# Patient Record
Sex: Female | Born: 1940 | Race: White | Hispanic: No | Marital: Married | Smoking: Never smoker
Health system: Southern US, Community
[De-identification: ages and names within clinical notes are randomized; demographics above are authoritative.]

## PROBLEM LIST (undated history)

## (undated) DIAGNOSIS — I639 Cerebral infarction, unspecified: Secondary | ICD-10-CM

## (undated) DIAGNOSIS — E7800 Pure hypercholesterolemia, unspecified: Secondary | ICD-10-CM

## (undated) DIAGNOSIS — I10 Essential (primary) hypertension: Secondary | ICD-10-CM

---

## 1969-04-13 HISTORY — PX: BREAST LUMPECTOMY: SHX2

## 1979-04-14 HISTORY — PX: VAGINAL HYSTERECTOMY: SUR661

## 1998-05-08 ENCOUNTER — Emergency Department (HOSPITAL_COMMUNITY): Admission: EM | Admit: 1998-05-08 | Discharge: 1998-05-08 | Payer: Self-pay | Admitting: Emergency Medicine

## 1998-07-05 ENCOUNTER — Other Ambulatory Visit: Admission: RE | Admit: 1998-07-05 | Discharge: 1998-07-05 | Payer: Self-pay | Admitting: *Deleted

## 1999-07-13 ENCOUNTER — Other Ambulatory Visit: Admission: RE | Admit: 1999-07-13 | Discharge: 1999-07-13 | Payer: Self-pay | Admitting: *Deleted

## 2000-07-22 ENCOUNTER — Other Ambulatory Visit: Admission: RE | Admit: 2000-07-22 | Discharge: 2000-07-22 | Payer: Self-pay | Admitting: *Deleted

## 2001-07-22 ENCOUNTER — Other Ambulatory Visit: Admission: RE | Admit: 2001-07-22 | Discharge: 2001-07-22 | Payer: Self-pay | Admitting: *Deleted

## 2003-02-08 ENCOUNTER — Ambulatory Visit (HOSPITAL_COMMUNITY): Admission: RE | Admit: 2003-02-08 | Discharge: 2003-02-08 | Payer: Self-pay | Admitting: Internal Medicine

## 2011-09-20 ENCOUNTER — Emergency Department (HOSPITAL_COMMUNITY): Payer: Medicare Other

## 2011-09-20 ENCOUNTER — Encounter (HOSPITAL_COMMUNITY): Payer: Self-pay | Admitting: *Deleted

## 2011-09-20 ENCOUNTER — Emergency Department (HOSPITAL_COMMUNITY)
Admission: EM | Admit: 2011-09-20 | Discharge: 2011-09-20 | Disposition: A | Discharge status: Home / Self Care | Payer: Medicare Other | Attending: Emergency Medicine | Admitting: Emergency Medicine

## 2011-09-20 DIAGNOSIS — M25476 Effusion, unspecified foot: Secondary | ICD-10-CM | POA: Insufficient documentation

## 2011-09-20 DIAGNOSIS — I10 Essential (primary) hypertension: Secondary | ICD-10-CM | POA: Insufficient documentation

## 2011-09-20 DIAGNOSIS — M25579 Pain in unspecified ankle and joints of unspecified foot: Secondary | ICD-10-CM | POA: Insufficient documentation

## 2011-09-20 DIAGNOSIS — Z79899 Other long term (current) drug therapy: Secondary | ICD-10-CM | POA: Insufficient documentation

## 2011-09-20 DIAGNOSIS — M25473 Effusion, unspecified ankle: Secondary | ICD-10-CM | POA: Insufficient documentation

## 2011-09-20 DIAGNOSIS — E7800 Pure hypercholesterolemia, unspecified: Secondary | ICD-10-CM | POA: Insufficient documentation

## 2011-09-20 DIAGNOSIS — M25569 Pain in unspecified knee: Secondary | ICD-10-CM | POA: Insufficient documentation

## 2011-09-20 HISTORY — DX: Essential (primary) hypertension: I10

## 2011-09-20 HISTORY — DX: Pure hypercholesterolemia, unspecified: E78.00

## 2011-09-20 MED ORDER — IBUPROFEN 200 MG PO TABS
600.0000 mg | ORAL_TABLET | Freq: Once | ORAL | Status: AC
  Administered 2011-09-20: 600 mg via ORAL
  Filled 2011-09-20: qty 3

## 2011-09-20 MED ORDER — OXYCODONE-ACETAMINOPHEN 5-325 MG PO TABS
1.0000 | ORAL_TABLET | Freq: Once | ORAL | Status: AC
  Administered 2011-09-20: 1 via ORAL
  Filled 2011-09-20: qty 1

## 2011-09-20 MED ORDER — NAPROXEN 500 MG PO TABS: 500.0000 mg | ORAL_TABLET | Freq: Two times a day (BID) | ORAL | Status: DC

## 2011-09-20 MED ORDER — HYDROCODONE-ACETAMINOPHEN 5-500 MG PO TABS: 1.0000 | ORAL_TABLET | Freq: Four times a day (QID) | ORAL | Status: AC | PRN

## 2011-09-20 NOTE — ED Notes (Signed)
Pt states "I have been having trouble with my leg but today, I was walking & if felt like it locked & popped, hurts in the back"; pt indicates is left posterior knee

## 2011-09-20 NOTE — ED Provider Notes (Signed)
History     CSN: [id number]  Arrival date & time 09/20/11  1529   First MD Initiated Contact with Patient 09/20/11 1610      Chief Complaint  Patient presents with  . Knee Pain    (Consider location/radiation/quality/duration/timing/severity/associated sxs/prior treatment) HPI  Patient presents to emergency department complaining of left knee pain. Patient states that for greater than a month she'll notice aching or soreness in her left knee particularly when she does activities like walking on the treadmill at the gym or climbing stairs however cycling or using the elliptical has little to no pain. Patient states that she has taken occasional Aleve with good relief of the pain until patient states that today she was walking through her kitchen and felt her knee "pop and almost give out." At that time patient states she had acute onset of pain with pain aggravated by weight bearing and ambulation. Patient did not take anything for pain prior to arrival. Patient denies any swelling or deformity of knee. Pain is aggravated by weight bearing mostly. Patient denies any pain radiating from her knee. Patient states pain is primarily in the posterior aspect of knee. Patient denies history of having an orthopedic surgeon. Patient denies chest pain, shortness of breath, coughing, hemoptysis, history of PE, or DVT, recent immobilization or travel.   Past Medical History  Diagnosis Date  . Hypertension   . Hypercholesteremia     Past Surgical History  Procedure Date  . Abdominal hysterectomy   . Breast lumpectomy     right    No family history on file.  History  Substance Use Topics  . Smoking status: Never Smoker   . Smokeless tobacco: Not on file  . Alcohol Use: No    OB History    Grav Para Term Preterm Abortions TAB SAB Ect Mult Living                  Review of Systems  All other systems reviewed and are negative.    Allergies  Review of patient's allergies indicates no  known allergies.  Home Medications   Current Outpatient Rx  Name Route Sig Dispense Refill  . ASPIRIN EC 81 MG PO TBEC Oral Take 81 mg by mouth daily.    . ATENOLOL 50 MG PO TABS Oral Take 50 mg by mouth daily.    Marland Kitchen CALCIUM CARBONATE-VITAMIN D 500-200 MG-UNIT PO TABS Oral Take 1 tablet by mouth daily.    . ADULT MULTIVITAMIN W/MINERALS CH Oral Take 1 tablet by mouth daily.    Marland Kitchen NAPROXEN SODIUM 220 MG PO TABS Oral Take 440 mg by mouth 2 (two) times daily as needed. For pain.    Marland Kitchen ROSUVASTATIN CALCIUM 10 MG PO TABS Oral Take 5 mg by mouth daily.    Marland Kitchen VALSARTAN-HYDROCHLOROTHIAZIDE 320-25 MG PO TABS Oral Take 1 tablet by mouth daily.      BP 157/89  Pulse 62  Temp(Src) 98.9 F (37.2 C) (Oral)  Resp 18  Ht 5\' 3"  (1.6 m)  Wt 186 lb (84.369 kg)  BMI 32.95 kg/m2  SpO2 100%  Physical Exam  Constitutional: She is oriented to person, place, and time. She appears well-developed and well-nourished. No distress.  HENT:  Head: Normocephalic and atraumatic.  Eyes: Conjunctivae are normal.  Cardiovascular: Normal rate and regular rhythm.   Pulmonary/Chest: Effort normal.  Musculoskeletal: Normal range of motion. She exhibits tenderness. She exhibits no edema.       Right ankle: She exhibits swelling.  tenderness.       TTP of left posterior knee in popliteal fossa but no palpable mass or fullness. Pain with FROM but no crepitous or laxity with ant/post/medial/lateral stress of knee.  No TTP or pain with FROM of left hip or ankle.   Neurological: She is alert and oriented to person, place, and time.       Normal sensation of entire foot.   Skin: Skin is warm and dry. No rash noted. She is not diaphoretic. No erythema. No pallor.  Psychiatric: She has a normal mood and affect. Her behavior is normal.    ED Course  Procedures (including critical care time)  PO ibuprofen and percocet.   Crutches and immobilizer.   Labs Reviewed - No data to display Dg Knee Complete 4 Views  Left  09/20/2011  *RADIOLOGY REPORT*  Clinical Data: Knee pain  LEFT KNEE - COMPLETE 4+ VIEW  Comparison: None.  Findings: Four views of the left knee submitted.  No acute fracture or subluxation.  Mild spurring of the patella is noted.  No radiopaque foreign body.  Minimal narrowing of medial joint compartment.  No joint effusion.  IMPRESSION: .  No acute fracture or subluxation.  Mild degenerative changes.  Original Report Authenticated By: Natasha Mead, M.D.     1. Knee pain       MDM  Patient hx of one month of knee pain with "pounding or weight bearing" with "give our or pop" today question meniscal injury. LLE is neurovascularly intact without acute findings on xray. Patient is agreeable to follow up with ortho and her PCP for further evaluation of ongoing knee pain.         Jenness Corner, [place] 09/20/11 1657

## 2011-09-20 NOTE — ED Provider Notes (Signed)
Medical screening examination/treatment/procedure(s) were conducted as a shared visit with non-physician practitioner(s) and myself.  I personally evaluated the patient during the encounter   Meredith Brunelle A. Patrica Duel, MD 09/20/11 2316

## 2012-03-12 ENCOUNTER — Other Ambulatory Visit: Payer: Self-pay | Admitting: Internal Medicine

## 2012-03-12 ENCOUNTER — Emergency Department (HOSPITAL_COMMUNITY): Payer: Medicare Other

## 2012-03-12 ENCOUNTER — Encounter (HOSPITAL_COMMUNITY): Payer: Self-pay | Admitting: *Deleted

## 2012-03-12 ENCOUNTER — Inpatient Hospital Stay (HOSPITAL_COMMUNITY)
Admission: EM | Admit: 2012-03-12 | Discharge: 2012-03-14 | DRG: 066 | Disposition: A | Discharge status: Home / Self Care | Payer: Medicare Other | Attending: Internal Medicine | Admitting: Internal Medicine

## 2012-03-12 DIAGNOSIS — R911 Solitary pulmonary nodule: Secondary | ICD-10-CM | POA: Diagnosis present

## 2012-03-12 DIAGNOSIS — R29898 Other symptoms and signs involving the musculoskeletal system: Secondary | ICD-10-CM

## 2012-03-12 DIAGNOSIS — I6350 Cerebral infarction due to unspecified occlusion or stenosis of unspecified cerebral artery: Principal | ICD-10-CM | POA: Diagnosis present

## 2012-03-12 DIAGNOSIS — I6789 Other cerebrovascular disease: Secondary | ICD-10-CM | POA: Diagnosis present

## 2012-03-12 DIAGNOSIS — E785 Hyperlipidemia, unspecified: Secondary | ICD-10-CM | POA: Diagnosis present

## 2012-03-12 DIAGNOSIS — Z7982 Long term (current) use of aspirin: Secondary | ICD-10-CM

## 2012-03-12 DIAGNOSIS — I639 Cerebral infarction, unspecified: Secondary | ICD-10-CM | POA: Diagnosis present

## 2012-03-12 DIAGNOSIS — E663 Overweight: Secondary | ICD-10-CM | POA: Diagnosis present

## 2012-03-12 DIAGNOSIS — Z79899 Other long term (current) drug therapy: Secondary | ICD-10-CM

## 2012-03-12 DIAGNOSIS — I10 Essential (primary) hypertension: Secondary | ICD-10-CM | POA: Diagnosis present

## 2012-03-12 HISTORY — DX: Cerebral infarction, unspecified: I63.9

## 2012-03-12 LAB — CBC WITH DIFFERENTIAL/PLATELET
Basophils Absolute: 0.1 10*3/uL (ref 0.0–0.1)
Eosinophils Absolute: 0.1 10*3/uL (ref 0.0–0.7)
Eosinophils Relative: 1 % (ref 0–5)
HCT: 38.1 % (ref 36.0–46.0)
Lymphocytes Relative: 33 % (ref 12–46)
MCH: 31.5 pg (ref 26.0–34.0)
MCHC: 34.4 g/dL (ref 30.0–36.0)
MCV: 91.6 fL (ref 78.0–100.0)
Monocytes Absolute: 0.6 10*3/uL (ref 0.1–1.0)
Platelets: 212 10*3/uL (ref 150–400)
RDW: 12.8 % (ref 11.5–15.5)
WBC: 6 10*3/uL (ref 4.0–10.5)

## 2012-03-12 LAB — URINALYSIS, ROUTINE W REFLEX MICROSCOPIC
Glucose, UA: NEGATIVE mg/dL
Hgb urine dipstick: NEGATIVE
Ketones, ur: NEGATIVE mg/dL
Protein, ur: NEGATIVE mg/dL
Urobilinogen, UA: 0.2 mg/dL (ref 0.0–1.0)

## 2012-03-12 LAB — GLUCOSE, CAPILLARY: Glucose-Capillary: 91 mg/dL (ref 70–99)

## 2012-03-12 LAB — BASIC METABOLIC PANEL
CO2: 28 mEq/L (ref 19–32)
Calcium: 10.2 mg/dL (ref 8.4–10.5)
Creatinine, Ser: 1.05 mg/dL (ref 0.50–1.10)
GFR calc non Af Amer: 53 mL/min — ABNORMAL LOW (ref >90)
Glucose, Bld: 96 mg/dL (ref 70–99)
Sodium: 141 mEq/L (ref 135–145)

## 2012-03-12 MED ORDER — SODIUM CHLORIDE 0.9 % IV SOLN
INTRAVENOUS | Status: DC
  Administered 2012-03-12: 125 mL/h via INTRAVENOUS

## 2012-03-12 NOTE — ED Notes (Signed)
Report given to Samantha, RN

## 2012-03-12 NOTE — ED Notes (Signed)
Pt undressed, in gown, speaking with registration at this time

## 2012-03-12 NOTE — ED Provider Notes (Signed)
History     CSN: [id number]  Arrival date & time 03/12/12  1536   First MD Initiated Contact with Patient 03/12/12 1613      Chief Complaint  Patient presents with  . Extremity Weakness    (Consider location/radiation/quality/duration/timing/severity/associated sxs/prior treatment) Patient is a 71 y.o. female presenting with neurologic complaint. The history is provided by the patient.  Neurologic Problem The primary symptoms include focal weakness. Primary symptoms do not include headaches, syncope, loss of consciousness, altered mental status, seizures, dizziness, visual change, paresthesias, loss of sensation, speech change, memory loss, fever, nausea or vomiting. The symptoms began 12 to 24 hours ago. The symptoms are unchanged. The neurological symptoms are focal. Context: upon waking up.  Region/motion of weakness: L arm. There is no impairment of the following actions: swallowing or articulating words.  Additional symptoms include weakness and loss of balance. Additional symptoms do not include pain, lower back pain, nystagmus or vertigo. Medical issues also include hypertension. Medical issues do not include seizures or cerebral vascular accident.    Past Medical History  Diagnosis Date  . Hypertension   . Hypercholesteremia     Past Surgical History  Procedure Date  . Abdominal hysterectomy   . Breast lumpectomy     right    History reviewed. No pertinent family history.  History  Substance Use Topics  . Smoking status: Never Smoker   . Smokeless tobacco: Not on file  . Alcohol Use: No    OB History    Grav Para Term Preterm Abortions TAB SAB Ect Mult Living                  Review of Systems  Constitutional: Negative for fever and chills.  Respiratory: Negative for cough and shortness of breath.   Cardiovascular: Negative for chest pain, palpitations and syncope.  Gastrointestinal: Negative for nausea, vomiting and abdominal pain.  Musculoskeletal:  Negative for back pain.  Skin: Negative for color change and rash.  Neurological: Positive for focal weakness, weakness and loss of balance. Negative for dizziness, vertigo, speech change, seizures, loss of consciousness, syncope, facial asymmetry, speech difficulty, light-headedness, numbness, headaches and paresthesias.  Psychiatric/Behavioral: Negative for memory loss, confusion and altered mental status.  All other systems reviewed and are negative.    Allergies  Review of patient's allergies indicates no known allergies.  Home Medications   Current Outpatient Rx  Name Route Sig Dispense Refill  . ASPIRIN EC 81 MG PO TBEC Oral Take 81 mg by mouth daily.    . ATENOLOL 50 MG PO TABS Oral Take 50 mg by mouth daily.    Marland Kitchen CALCIUM CARBONATE-VITAMIN D 500-200 MG-UNIT PO TABS Oral Take 1 tablet by mouth daily.    . ADULT MULTIVITAMIN W/MINERALS CH Oral Take 1 tablet by mouth daily.    Marland Kitchen ROSUVASTATIN CALCIUM 10 MG PO TABS Oral Take 5 mg by mouth daily.    Marland Kitchen VALSARTAN-HYDROCHLOROTHIAZIDE 320-25 MG PO TABS Oral Take 1 tablet by mouth daily.      BP 184/90  Pulse 51  Temp 98.3 F (36.8 C) (Oral)  Resp 18  SpO2 99%  Physical Exam  Nursing note and vitals reviewed. Constitutional: She is oriented to person, place, and time. She appears well-developed and well-nourished.  HENT:  Head: Normocephalic and atraumatic.  Eyes: EOM are normal. Pupils are equal, round, and reactive to light. Left eye exhibits no nystagmus.  Cardiovascular: Normal rate, regular rhythm, normal heart sounds and intact distal pulses.   Pulmonary/Chest:  Effort normal and breath sounds normal. No respiratory distress.  Abdominal: Soft. She exhibits no distension. There is no tenderness.  Neurological: She is alert and oriented to person, place, and time. No cranial nerve deficit or sensory deficit. She displays a negative Romberg sign. Gait normal. GCS eye subscore is 4. GCS verbal subscore is 5. GCS motor subscore is  6.  Reflex Scores:      Bicep reflexes are 2+ on the right side and 2+ on the left side.      Patellar reflexes are 2+ on the right side and 2+ on the left side.      4/5 strength L triceps, biceps, deltoids, otherwise 5/5 throughout. Dysmetria L arm; normal coordination with R arm, bilateral heel-to-shin  Skin: Skin is warm and dry.  Psychiatric: She has a normal mood and affect.    ED Course  Procedures (including critical care time)  Labs Reviewed  BASIC METABOLIC PANEL - Abnormal; Notable for the following:    GFR calc non Af Amer 53 (*)     GFR calc Af Amer 61 (*)     All other components within normal limits  URINALYSIS, ROUTINE W REFLEX MICROSCOPIC - Abnormal; Notable for the following:    Leukocytes, UA SMALL (*)     All other components within normal limits  GLUCOSE, CAPILLARY  CBC WITH DIFFERENTIAL  URINE MICROSCOPIC-ADD ON   Ct Head Wo Contrast  03/12/2012  *RADIOLOGY REPORT*  Clinical Data: Extremity weakness.  CT HEAD WITHOUT CONTRAST  Technique:  Contiguous axial images were obtained from the base of the skull through the vertex without contrast.  Comparison: None.  Findings: No evidence for acute hemorrhage, mass lesion, midline shift, hydrocephalus or large infarct.  Patchy areas of low density in the subcortical white matter, particularly in the parietal lobes.  The visualized sinuses are clear.  No acute osseous abnormality.  IMPRESSION: No acute intracranial abnormality.  Patchy areas of low density within the white matter.  Findings most likely represent chronic small vessel ischemic changes.  Original Report Authenticated By: Richarda Overlie, M.D.   Mr Angiogram Head Wo Contrast  03/12/2012  *RADIOLOGY REPORT*  Clinical Data:  Left arm weakness.  Dysmetria.  MRI HEAD WITHOUT CONTRAST MRA HEAD WITHOUT CONTRAST  Technique:  Multiplanar, multiecho pulse sequences of the brain and surrounding structures were obtained without intravenous contrast. Angiographic images of the head  were obtained using MRA technique without contrast.  Comparison:  CT head without contrast 03/12/2012.  MRI HEAD  Findings:  An acute non hemorrhagic linear cortical infarct is present in the posterior right frontal lobe, likely along the primary motor cortex.  Focal T2 and FLAIR hyperintensities associated with this lesion, compatible with an infarct of several hours.  No mass lesion or other acute hemorrhage is present.  Confluent periventricular and scattered subcortical and T2 FLAIR hyperintensities are present bilaterally.  Flow is present in the major intracranial arteries.  The globes and orbits are intact.  The paranasal sinuses and mastoid air cells are clear.  IMPRESSION:  1.  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 The lesion measures approximately 1.5 cm. 2.  Atrophy and extensive white matter disease.  This likely reflects the sequelae of chronic microvascular ischemia.  MRA HEAD  Findings: The left internal carotid artery is within normal limits. There is a slight downward outpouching of the right internal carotid artery at the level of the posterior communicating artery, measuring less than  1 mm.  The A1 and M1 segments are normal.  The anterior communicating artery is patent.  The MCA bifurcations are within normal limits bilaterally.  The ACA and MCA branch vessels are unremarkable.  The left vertebral artery is slightly dominant to the right.  The left PICA origin is visualized and normal.  Prominent AICA vessels are seen bilaterally.  The basilar artery is within normal limits. Both posterior cerebral arteries originate from basilar tip.  The PCA branch vessels are within normal limits bilaterally.  IMPRESSION:  1.  Tiny, less than 1 mm, right posterior communicating artery aneurysm. 2.  Otherwise unremarkable MRA circle of Willis without evidence for significant proximal stenosis, branch vessel occlusion, or other aneurysm.  Original  Report Authenticated By: Jamesetta Orleans. MATTERN, M.D.   Mr Brain Wo Contrast  03/12/2012  *RADIOLOGY REPORT*  Clinical Data:  Left arm weakness.  Dysmetria.  MRI HEAD WITHOUT CONTRAST MRA HEAD WITHOUT CONTRAST  Technique:  Multiplanar, multiecho pulse sequences of the brain and surrounding structures were obtained without intravenous contrast. Angiographic images of the head were obtained using MRA technique without contrast.  Comparison:  CT head without contrast 03/12/2012.  MRI HEAD  Findings:  An acute non hemorrhagic linear cortical infarct is present in the posterior right frontal lobe, likely along the primary motor cortex.  Focal T2 and FLAIR hyperintensities associated with this lesion, compatible with an infarct of several hours.  No mass lesion or other acute hemorrhage is present.  Confluent periventricular and scattered subcortical and T2 FLAIR hyperintensities are present bilaterally.  Flow is present in the major intracranial arteries.  The globes and orbits are intact.  The paranasal sinuses and mastoid air cells are clear.  IMPRESSION:  1.  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 The lesion measures approximately 1.5 cm. 2.  Atrophy and extensive white matter disease.  This likely reflects the sequelae of chronic microvascular ischemia.  MRA HEAD  Findings: The left internal carotid artery is within normal limits. There is a slight downward outpouching of the right internal carotid artery at the level of the posterior communicating artery, measuring less than 1 mm.  The A1 and M1 segments are normal.  The anterior communicating artery is patent.  The MCA bifurcations are within normal limits bilaterally.  The ACA and MCA branch vessels are unremarkable.  The left vertebral artery is slightly dominant to the right.  The left PICA origin is visualized and normal.  Prominent AICA vessels are seen bilaterally.  The basilar artery is within  normal limits. Both posterior cerebral arteries originate from basilar tip.  The PCA branch vessels are within normal limits bilaterally.  IMPRESSION:  1.  Tiny, less than 1 mm, right posterior communicating artery aneurysm. 2.  Otherwise unremarkable MRA circle of Willis without evidence for significant proximal stenosis, branch vessel occlusion, or other aneurysm.  Original Report Authenticated By: Jamesetta Orleans. MATTERN, M.D.    Date: 03/12/2012  Rate: 50  Rhythm: sinus bradycardia  QRS Axis: normal  Intervals: PR prolonged  ST/T Wave abnormalities: normal  Conduction Disutrbances:first-degree A-V block   Narrative Interpretation:   Old EKG Reviewed: none available     1. Stroke       MDM  71 year old left-hand dominant female presenting with left arm weakness. She states was present when she woke up this morning, and she noticed it when she was trying to hold the phone. She also notes that when she is  trying to use the elliptical she was having difficulties balancing on it, but has not noticed any other balance difficulties today. She has no other neurologic complaints, has no known injuries to the arm, and has never had previous similar symptoms before. She was seen by her regular Dr., who referred her to the ED for further evaluation due to his inability to get imaging as an outpatient. The patient does get a mild wedge discomfort lasting for about 20 minutes while she was at her doctor's office, but denies any other headaches, any head trauma. She has mild weakness of her left arm and some dysmetria as above. CT scan obtained was unremarkable. Neurology was consult in, and an MRI was obtained, which showed a acute stroke so an MRA was obtained which was grossly unremarkable. The patient will be admitted to the hospitalist service for further management. The patient was updated with these findings and the plan.        Theotis Burrow, MD 03/13/12 1610  Theotis Burrow, MD 03/13/12 [phone]

## 2012-03-12 NOTE — ED Notes (Signed)
To ED for eval of loss of control of left arm. Pt states she woke with this today. Pt states she had a small HA earlier today. No pain now. Pt is walking without any difficulty. No slurred speech. No facial droop.

## 2012-03-12 NOTE — ED Notes (Signed)
Family at bedside. 

## 2012-03-12 NOTE — ED Provider Notes (Signed)
I saw and evaluated the patient, reviewed the resident's note and I agree with the findings and plan. LHD female.  Was talking on phone, felt as if left arm was clumsy.  Could not hold phone.   Says she cannot control it. sxs began this am.  No pain. No ha, n/v/vision changes.  No hx of stroke or ami. Nonsmoker.  On exam no distress.  Cn nl. hrt and lungs nl.  Has dysmetria on left when asked to do F---> N test.   Strength in left upper ext 4/5.  bilat legs nl.   Ct neg.  Will do mri for eval of clumsy left arm and dysmetria.  Cheri Guppy, MD 03/12/12 [phone]

## 2012-03-12 NOTE — Consult Note (Signed)
Reason for Consult: Left arm clumsiness Referring Physician: ER, primary doctor is Dr. Tami Ribas Huff is an 71 y.o. female.  HPI:  Meredith Huff is a 71 year old left-handed white female with a history of hypertension and dyslipidemia. This patient was in good health, last seen normal around 9:30 PM on 03/11/2012. The patient went to bed at that time, and she got out of bed around 6 AM, and realized that the left arm was weak and clumsy. The left felt heavy, but she denied any problems with the legs. The patient had no numbness, visual disturbance, slurred speech, problems swallowing. The patient did report a mild headache. The patient went to work out, and noted that she had some balance problems on the elliptical machine. The patient eventually went to the emergency room as the left arm clumsiness did not improve. The patient denies any prior history of weakness or clumsiness. The patient has undergone a CT scan of the head that was unremarkable. MRI the brain is pending. NIH stroke scale score is 2. The patient is not a TPA candidate secondary to the duration of the symptoms. Neurology was called for an evaluation. The patient was on low-dose aspirin prior to admission.     Past Medical History  Diagnosis Date  . Hypertension   . Hypercholesteremia   . Stroke     Left arm clumsiness    Past Surgical History  Procedure Date  . Abdominal hysterectomy   . Breast lumpectomy     right    Family History  Problem Relation Age of Onset  . Heart attack Father   . Dementia Mother     Stroke, heart disease    Social History:  reports that she has never smoked. She does not have any smokeless tobacco history on file. She reports that she does not drink alcohol or use illicit drugs.  Allergies: No Known Allergies  Medications:  Prior to Admission:  (Not in a hospital admission) Scheduled:  Continuous:   . sodium chloride 125 mL/hr (03/12/12 1829)   PRN:  Results for orders  placed during the hospital encounter of 03/12/12 (from the past 48 hour(s))  GLUCOSE, CAPILLARY     Status: Normal   Collection Time   03/12/12  5:01 PM      Component Value Range Comment   Glucose-Capillary 91  70 - 99 mg/dL    Comment 1 Documented in Chart      Comment 2 Notify RN     CBC WITH DIFFERENTIAL     Status: Normal   Collection Time   03/12/12  5:30 PM      Component Value Range Comment   WBC 6.0  4.0 - 10.5 K/uL    RBC 4.16  3.87 - 5.11 MIL/uL    Hemoglobin 13.1  12.0 - 15.0 g/dL    HCT 16.1  09.6 - 04.5 %    MCV 91.6  78.0 - 100.0 fL    MCH 31.5  26.0 - 34.0 pg    MCHC 34.4  30.0 - 36.0 g/dL    RDW 40.9  81.1 - 91.4 %    Platelets 212  150 - 400 K/uL    Neutrophils Relative 55  43 - 77 %    Neutro Abs 3.3  1.7 - 7.7 K/uL    Lymphocytes Relative 33  12 - 46 %    Lymphs Abs 2.0  0.7 - 4.0 K/uL    Monocytes Relative 10  3 - 12 %  Monocytes Absolute 0.6  0.1 - 1.0 K/uL    Eosinophils Relative 1  0 - 5 %    Eosinophils Absolute 0.1  0.0 - 0.7 K/uL    Basophils Relative 1  0 - 1 %    Basophils Absolute 0.1  0.0 - 0.1 K/uL   BASIC METABOLIC PANEL     Status: Abnormal   Collection Time   03/12/12  5:30 PM      Component Value Range Comment   Sodium 141  135 - 145 mEq/L    Potassium 3.7  3.5 - 5.1 mEq/L    Chloride 101  96 - 112 mEq/L    CO2 28  19 - 32 mEq/L    Glucose, Bld 96  70 - 99 mg/dL    BUN 20  6 - 23 mg/dL    Creatinine, Ser 1.61  0.50 - 1.10 mg/dL    Calcium 09.6  8.4 - 10.5 mg/dL    GFR calc non Af Amer 53 (*) >90 mL/min    GFR calc Af Amer 61 (*) >90 mL/min   URINALYSIS, ROUTINE W REFLEX MICROSCOPIC     Status: Abnormal   Collection Time   03/12/12  6:47 PM      Component Value Range Comment   Color, Urine YELLOW  YELLOW    APPearance CLEAR  CLEAR    Specific Gravity, Urine 1.010  1.005 - 1.030    pH 7.0  5.0 - 8.0    Glucose, UA NEGATIVE  NEGATIVE mg/dL    Hgb urine dipstick NEGATIVE  NEGATIVE    Bilirubin Urine NEGATIVE  NEGATIVE    Ketones,  ur NEGATIVE  NEGATIVE mg/dL    Protein, ur NEGATIVE  NEGATIVE mg/dL    Urobilinogen, UA 0.2  0.0 - 1.0 mg/dL    Nitrite NEGATIVE  NEGATIVE    Leukocytes, UA SMALL (*) NEGATIVE   URINE MICROSCOPIC-ADD ON     Status: Normal   Collection Time   03/12/12  6:47 PM      Component Value Range Comment   Squamous Epithelial / LPF RARE  RARE    WBC, UA 3-6  <3 WBC/hpf     Ct Head Wo Contrast  03/12/2012  *RADIOLOGY REPORT*  Clinical Data: Extremity weakness.  CT HEAD WITHOUT CONTRAST  Technique:  Contiguous axial images were obtained from the base of the skull through the vertex without contrast.  Comparison: None.  Findings: No evidence for acute hemorrhage, mass lesion, midline shift, hydrocephalus or large infarct.  Patchy areas of low density in the subcortical white matter, particularly in the parietal lobes.  The visualized sinuses are clear.  No acute osseous abnormality.  IMPRESSION: No acute intracranial abnormality.  Patchy areas of low density within the white matter.  Findings most likely represent chronic small vessel ischemic changes.  Original Report Authenticated By: Richarda Overlie, M.D.    @ROS @  Review of systems is notable for:  Mild headache is noted, the patient denies visual complaints, visual loss.  The patient denies slurred speech, problems swallowing.  The patient denies neck pain, low back pain, or extremity pain.  The patient denies shortness of breath, chest pain, abdominal pain, nausea or vomiting.  The patient denies dizziness, or syncope.  The patient denies any significant gait disturbance, but she does have some chronic issues with bladder control. The patient denies any issues with bowel control.    Blood pressure 158/120, pulse 58, temperature 98.3 F (36.8 C), temperature source Oral, resp. rate  18, SpO2 98.00%.  Physical Examination:  General:  The patient is alert and cooperative at the time of the examination. The patient is oriented  x3.  Respiratory:  Lungs fields are clear to auscultation bilaterally.  Cardiovascular:  Examination reveals a regular rate and rhythm, no obvious murmurs or rubs are noted.  Abdominal Exam:  Abdomen is soft and nontender, positive bowel sounds are noted. No organomegaly is noted.  Extremities:  Extremities are without significant edema.    Neurologic Examination  Cranial Nerves:  Facial symmetry is present. Pupils are equal, round, and reactive to light. Visual fields are full. Speech is normal, no aphasia or dysarthria is noted. Pin prick sensation on the face is symmetric and normal.  Motor Examination: Motor testing of all 4 extremities reveals normal strength of the arms and the legs, with exception of some decreased grip strength on the left arm. Mild drift was noted with a left lower extremity.   Sensory Examination: Sensory examination of the arms and legs shows normal pinprick, soft touch, and vibration sensation throughout.  Cerebellar Examination: The patient has good finger-nose-finger and heel-to-shin bilaterally, with the exception of some ataxia with finger-nose-finger with the left upper extremity. Gait was not tested.  Deep Tendon Reflexes: Deep tendon reflexes are symmetric and normal throughout. Toes are downgoing bilaterally.  Interval: Baseline (07/31 1642) Level of Consciousness (1a. ): Alert, keenly responsive (07/31 1900) LOC Questions (1b. ): Answers both questions correctly (07/31 1900) LOC Commands (1c. ): Performs both tasks correctly (07/31 1900) Best Gaze (2. ): Normal (07/31 1900) Visual (3. ): No visual loss (07/31 1900) Facial Palsy (4. ): Normal symmetrical movements (07/31 1900) Motor Arm, Left (5a. ): No drift (07/31 1900) Motor Arm, Right (5b. ): No drift (07/31 1900) Motor Leg, Left (6a. ): Drift (07/31 1900) Motor Leg, Right (6b. ): No drift (07/31 1900) Limb Ataxia (7. ): Present in one limb (07/31 1900) Sensory (8. ): Normal, no  sensory loss (07/31 1900) Best Language (9. ): No aphasia (07/31 1900) Dysarthria (10. ): Normal (07/31 1900) Inattention/Extinction: No Abnormality (07/31 1900) Total: 2  (07/31 1900)   Assessment/Plan:  1. 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 Hypertension  3. Dyslipidemia  The patient does have some risk factors for stroke. The patient was on aspirin prior to coming in. The patient will require a stroke workup. The patient will have an MRI study of the brain, and she will need a MRA of the head and neck. The patient will have a 2-D echocardiogram. Aspirin will be continued for now, but Plavix may need to be substituted in the future. A fasting lipid panel will be done. Neurology will follow.   Meredith Huff KEITH 03/12/2012, 7:49 PM

## 2012-03-13 ENCOUNTER — Other Ambulatory Visit: Payer: Medicare Other

## 2012-03-13 ENCOUNTER — Inpatient Hospital Stay (HOSPITAL_COMMUNITY): Payer: Medicare Other

## 2012-03-13 ENCOUNTER — Encounter (HOSPITAL_COMMUNITY): Payer: Self-pay | Admitting: General Practice

## 2012-03-13 DIAGNOSIS — I6350 Cerebral infarction due to unspecified occlusion or stenosis of unspecified cerebral artery: Secondary | ICD-10-CM

## 2012-03-13 DIAGNOSIS — E785 Hyperlipidemia, unspecified: Secondary | ICD-10-CM

## 2012-03-13 DIAGNOSIS — I10 Essential (primary) hypertension: Secondary | ICD-10-CM

## 2012-03-13 LAB — LIPID PANEL
Cholesterol: 165 mg/dL (ref 0–200)
LDL Cholesterol: 92 mg/dL (ref 0–99)
Total CHOL/HDL Ratio: 3.6 RATIO
VLDL: 27 mg/dL (ref 0–40)

## 2012-03-13 MED ORDER — IRBESARTAN 300 MG PO TABS
300.0000 mg | ORAL_TABLET | Freq: Every day | ORAL | Status: DC
  Administered 2012-03-13 – 2012-03-14 (×2): 300 mg via ORAL
  Filled 2012-03-13 (×2): qty 1

## 2012-03-13 MED ORDER — ASPIRIN 325 MG PO TABS
325.0000 mg | ORAL_TABLET | Freq: Every day | ORAL | Status: DC
  Administered 2012-03-13 – 2012-03-14 (×2): 325 mg via ORAL
  Filled 2012-03-13 (×2): qty 1

## 2012-03-13 MED ORDER — CALCIUM CARBONATE-VITAMIN D 500-200 MG-UNIT PO TABS
1.0000 | ORAL_TABLET | Freq: Every day | ORAL | Status: DC
  Administered 2012-03-13 – 2012-03-14 (×2): 1 via ORAL
  Filled 2012-03-13 (×3): qty 1

## 2012-03-13 MED ORDER — ATORVASTATIN CALCIUM 10 MG PO TABS
10.0000 mg | ORAL_TABLET | Freq: Every day | ORAL | Status: DC
  Administered 2012-03-13: 10 mg via ORAL
  Filled 2012-03-13 (×2): qty 1

## 2012-03-13 MED ORDER — ASPIRIN EC 81 MG PO TBEC: 81.0000 mg | DELAYED_RELEASE_TABLET | Freq: Every day | ORAL | Status: DC

## 2012-03-13 MED ORDER — SENNOSIDES-DOCUSATE SODIUM 8.6-50 MG PO TABS: 1.0000 | ORAL_TABLET | Freq: Every evening | ORAL | Status: DC | PRN

## 2012-03-13 MED ORDER — HYDROCHLOROTHIAZIDE 25 MG PO TABS
25.0000 mg | ORAL_TABLET | Freq: Every day | ORAL | Status: DC
  Administered 2012-03-13 – 2012-03-14 (×2): 25 mg via ORAL
  Filled 2012-03-13 (×2): qty 1

## 2012-03-13 MED ORDER — ONDANSETRON HCL 4 MG/2ML IJ SOLN: 4.0000 mg | Freq: Four times a day (QID) | INTRAMUSCULAR | Status: DC | PRN

## 2012-03-13 MED ORDER — ADULT MULTIVITAMIN W/MINERALS CH
1.0000 | ORAL_TABLET | Freq: Every day | ORAL | Status: DC
Start: 2012-03-13 — End: 2012-03-14
  Administered 2012-03-13 – 2012-03-14 (×2): 1 via ORAL
  Filled 2012-03-13 (×2): qty 1

## 2012-03-13 MED ORDER — VALSARTAN-HYDROCHLOROTHIAZIDE 320-25 MG PO TABS: 1.0000 | ORAL_TABLET | Freq: Every day | ORAL | Status: DC

## 2012-03-13 MED ORDER — ASPIRIN 300 MG RE SUPP
300.0000 mg | Freq: Every day | RECTAL | Status: DC
  Filled 2012-03-13 (×2): qty 1

## 2012-03-13 MED ORDER — ENOXAPARIN SODIUM 40 MG/0.4ML ~~LOC~~ SOLN
40.0000 mg | Freq: Every day | SUBCUTANEOUS | Status: DC
  Administered 2012-03-13 – 2012-03-14 (×2): 40 mg via SUBCUTANEOUS
  Filled 2012-03-13 (×2): qty 0.4

## 2012-03-13 MED ORDER — ATENOLOL 50 MG PO TABS
50.0000 mg | ORAL_TABLET | Freq: Every day | ORAL | Status: DC
  Administered 2012-03-13 – 2012-03-14 (×2): 50 mg via ORAL
  Filled 2012-03-13 (×2): qty 1

## 2012-03-13 MED ORDER — SODIUM CHLORIDE 0.9 % IV SOLN
INTRAVENOUS | Status: DC
  Administered 2012-03-13: 01:00:00 via INTRAVENOUS

## 2012-03-13 NOTE — Progress Notes (Signed)
Occupational Therapy Evaluation Patient Details Name: Meredith Huff MRN: [id number] DOB: 1941-07-06 Today's Date: 03/13/2012 Time: 4098-1191 OT Time Calculation (min): 25 min  OT Assessment / Plan / Recommendation Clinical Impression  Pt admitted for LUE weakness and coordination deficits. 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  Will benefit from acute OT to address below problem list in prep for d/c home with spouse.  Recommending OPOT    OT Assessment  Patient needs continued OT Services    Follow Up Recommendations  Outpatient OT    Barriers to Discharge      Equipment Recommendations  None recommended by OT    Recommendations for Other Services    Frequency  Min 2X/week    Precautions / Restrictions     Pertinent Vitals/Pain See vitals    ADL  Eating/Feeding: Performed;Modified independent (used right hand) Where Assessed - Eating/Feeding: Chair Lower Body Dressing: Performed;Modified independent (donned socks with mostly using right hand) Where Assessed - Lower Body Dressing: Unsupported sitting Toilet Transfer: Simulated;Modified independent Toilet Transfer Method: Other (comment) (ambulating) Toilet Transfer Equipment: Other (comment) (chair) Equipment Used: Gait belt Transfers/Ambulation Related to ADLs: Mod I  ADL Comments: Pt reports she fed herslef using right hand this morning due to increased difficulty using left hand.  Encouraged pt to incorporate use of LUE as much as possible, and pt performed tasks such as opening door and picking up pen from floor with left hand but required increased time and effort.    OT Diagnosis: Paresis  OT Problem List: Decreased coordination;Impaired UE functional use OT Treatment Interventions: Therapeutic exercise;Self-care/ADL training;Patient/family education   OT Goals Acute Rehab OT Goals OT Goal Formulation: With  patient Time For Goal Achievement: 03/20/12 Potential to Achieve Goals: Good ADL Goals Additional ADL Goal #1: Pt will incorporate LUE into 75% of ADL activities. ADL Goal: Additional Goal #1 - Progress: Goal set today Miscellaneous OT Goals Miscellaneous OT Goal #1: Pt will independently perform LUE HEP. OT Goal: Miscellaneous Goal #1 - Progress: Goal set today  Visit Information  Last OT Received On: 03/13/12    Subjective Data      Prior Functioning  Vision/Perception  Home Living Lives With: Spouse Available Help at Discharge: Family;Available 24 hours/day Type of Home: House Home Access: Stairs to enter [employer] of Steps: 3 Entrance Stairs-Rails: Left Home Layout: One level Bathroom Shower/Tub: Tub/shower unit;Walk-in shower Bathroom Toilet: Handicapped height Bathroom Accessibility: Yes How Accessible: Accessible via walker Home Adaptive Equipment: None Prior Function Level of Independence: Independent Able to Take Stairs?: Yes Driving: Yes Vocation: Retired Dominant Hand: Left   Praxis Praxis: Impaired Praxis Impairment Details: Motor planning Praxis-Other Comments: LUE demosntrating with ataxic movements.    Cognition  Overall Cognitive Status: Appears within functional limits for tasks assessed/performed Arousal/Alertness: Awake/alert Orientation Level: Appears intact for tasks assessed Behavior During Session: [facility] for tasks performed    Extremity/Trunk Assessment Right Upper Extremity Assessment RUE ROM/Strength/Tone: Within functional levels RUE Sensation: WFL - Light Touch;WFL - Proprioception RUE Coordination: WFL - gross/fine motor Left Upper Extremity Assessment LUE ROM/Strength/Tone: WFL for tasks assessed LUE Sensation: Deficits LUE Sensation Deficits: Some tingling, but much of it has resolved. LUE Coordination: Deficits LUE Coordination Deficits: Significantly decreased gross and fine motor coordination.  Requires increased  time to complete movements. Right Lower Extremity Assessment RLE ROM/Strength/Tone: Within functional levels RLE Sensation: WFL - Light Touch;WFL - Proprioception RLE Coordination: WFL -  gross/fine motor Left Lower Extremity Assessment LLE ROM/Strength/Tone: Within functional levels LLE Sensation: WFL - Light Touch;WFL - Proprioception LLE Coordination: WFL - gross/fine motor Trunk Assessment Trunk Assessment: Normal   Mobility Bed Mobility Bed Mobility: Not assessed Details for Bed Mobility Assistance: pt sitting upright in reciner Transfers Sit to Stand: 6: Modified independent (Device/Increase time);With upper extremity assist;From chair/3-in-1;With armrests Stand to Sit: 6: Modified independent (Device/Increase time);With upper extremity assist;To chair/3-in-1;With armrests   Exercise    Balance Standardized Balance Assessment Standardized Balance Assessment: Dynamic Gait Index Dynamic Gait Index Level Surface: Normal Change in Gait Speed: Normal Gait with Horizontal Head Turns: Mild Impairment Gait with Vertical Head Turns: Mild Impairment Gait and Pivot Turn: Mild Impairment Step Over Obstacle: Normal Step Around Obstacles: Mild Impairment Steps: Normal Total Score: 20  High Level Balance High Level Balance Activites: Side stepping;Backward walking;Direction changes;Sudden stops;Head turns;Turns High Level Balance Comments: Pt able to perform above activites as well as picking up objects off the floor while ambulating without difficulty  End of Session OT - End of Session Equipment Utilized During Treatment: Gait belt Activity Tolerance: Patient tolerated treatment well Patient left: in chair;with call bell/phone within reach;with family/visitor present Nurse Communication: Mobility status  GO   03/13/2012 Cipriano Mile OTR/L Pager [phone] Office [phone]   Cipriano Mile 03/13/2012, 4:04 PM

## 2012-03-13 NOTE — Evaluation (Signed)
Speech Language Pathology Evaluation Patient Details Name: Meredith Huff MRN: [id number] DOB: May 31, 1941 Today's Date: 03/13/2012 Time: 4098-1191 SLP Time Calculation (min): 12 min  Problem List:  Patient Active Problem List  Diagnosis  . Stroke  . Hypertension  . Dyslipidemia   Past Medical History:  Past Medical History  Diagnosis Date  . Hypertension   . Hypercholesteremia   . Stroke 03/12/12    "can't control left arm"   Past Surgical History:  Past Surgical History  Procedure Date  . Vaginal hysterectomy 1980's  . Breast lumpectomy 1970's    right   HPI:  71 y.o. female admitted 03/12/12 with acute left arm weakness and numbness. 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   Assessment / Plan / Recommendation Clinical Impression  Demonstrates functional cognitive-communicative skills without any current evidence of an impairment.  Patient reports one episode, the day before her physical symptoms began, of not being able to recall how to thread her yarn in her sewing machine, which she defined as a frequent task she performed for many years.  Both the patient and her family (husband and daughter) deny any further episodes or indications of a cognitive change.  No further skilled SLP services warranted at this time.    SLP Assessment  Patient does not need any further Speech Lanaguage Pathology Services    Follow Up Recommendations  None    Pertinent Vitals/Pain n/a    SLP Evaluation Prior Functioning  Cognitive/Linguistic Baseline: Within functional limits Type of Home: House Lives With: Spouse Available Help at Discharge: Family Vocation: Retired   IT consultant  Overall Cognitive Status: Appears within functional limits for tasks assessed Orientation Level: Oriented X4 Comments: Patient reports one episode, the day before her physical symptoms began, of not being able to  recall how to thread her yarn in her sewing machine, which she defined as a frequent task she performed for many years.    Comprehension  Auditory Comprehension Overall Auditory Comprehension: Appears within functional limits for tasks assessed Visual Recognition/Discrimination Discrimination: Within Function Limits Reading Comprehension Reading Status: Within funtional limits    Expression Expression Primary Mode of Expression: Verbal Verbal Expression Overall Verbal Expression: Appears within functional limits for tasks assessed Written Expression Dominant Hand: Right Written Expression: Not tested   Oral / Motor Oral Motor/Sensory Function Overall Oral Motor/Sensory Function: Appears within functional limits for tasks assessed Motor Speech Overall Motor Speech: Appears within functional limits for tasks assessed     Myra Rude, M.S.,CCC-SLP Pager [phone] 03/13/2012, 10:03 AM

## 2012-03-13 NOTE — Progress Notes (Signed)
VASCULAR LAB PRELIMINARY  PRELIMINARY  PRELIMINARY  PRELIMINARY  Carotid duplex completed.    Preliminary report:  Bilateral:  No evidence of hemodynamically significant internal carotid artery stenosis.   Vertebral artery flow is antegrade.      Shikha Bibb, RVT 03/13/2012, 2:21 PM

## 2012-03-13 NOTE — H&P (Signed)
Meredith Huff is an 71 y.o. female.   Chief Complaint: Left upper extremity weakness HPI: A 71 year old female with history of hypertension hyperlipidemia who awoke last night at around 9 PM with left upper extremity clumsiness numbness and mild weakness. Patient feels like the arm is not part of her. She continued to him on onto she came to the emergency room. Her sensation has since improved and the left extremity however she still feels that she has no control over it. She is able to move the arm but not to keep it in one position for long. She feels clumsiness only in the left arm. The left leg was fine she was able to walk without a problem. Denied any nausea vomiting or diarrhea. No other area of weakness. No changes in her speech, no swallowing difficulties and no visual changes.  Past Medical History  Diagnosis Date  . Hypertension   . Hypercholesteremia   . Stroke 03/12/12    "can't control left arm"    Past Surgical History  Procedure Date  . Vaginal hysterectomy 1980's  . Breast lumpectomy 1970's    right    Family History  Problem Relation Age of Onset  . Heart attack Father   . Dementia Mother     Stroke, heart disease   Social History:  reports that she has never smoked. She has never used smokeless tobacco. She reports that she does not drink alcohol or use illicit drugs.  Allergies: No Known Allergies  Medications Prior to Admission  Medication Sig Dispense Refill  . aspirin EC 81 MG tablet Take 81 mg by mouth daily.      Marland Kitchen atenolol (TENORMIN) 50 MG tablet Take 50 mg by mouth daily.      . calcium-vitamin D (OSCAL WITH D) 500-200 MG-UNIT per tablet Take 1 tablet by mouth daily.      . Multiple Vitamin (MULITIVITAMIN WITH MINERALS) TABS Take 1 tablet by mouth daily.      . rosuvastatin (CRESTOR) 10 MG tablet Take 5 mg by mouth daily.      . valsartan-hydrochlorothiazide (DIOVAN-HCT) 320-25 MG per tablet Take 1 tablet by mouth daily.        Results for orders  placed during the hospital encounter of 03/12/12 (from the past 48 hour(s))  GLUCOSE, CAPILLARY     Status: Normal   Collection Time   03/12/12  5:01 PM      Component Value Range Comment   Glucose-Capillary 91  70 - 99 mg/dL    Comment 1 Documented in Chart      Comment 2 Notify RN     CBC WITH DIFFERENTIAL     Status: Normal   Collection Time   03/12/12  5:30 PM      Component Value Range Comment   WBC 6.0  4.0 - 10.5 K/uL    RBC 4.16  3.87 - 5.11 MIL/uL    Hemoglobin 13.1  12.0 - 15.0 g/dL    HCT 13.2  44.0 - 10.2 %    MCV 91.6  78.0 - 100.0 fL    MCH 31.5  26.0 - 34.0 pg    MCHC 34.4  30.0 - 36.0 g/dL    RDW 72.5  36.6 - 44.0 %    Platelets 212  150 - 400 K/uL    Neutrophils Relative 55  43 - 77 %    Neutro Abs 3.3  1.7 - 7.7 K/uL    Lymphocytes Relative 33  12 - 46 %  Lymphs Abs 2.0  0.7 - 4.0 K/uL    Monocytes Relative 10  3 - 12 %    Monocytes Absolute 0.6  0.1 - 1.0 K/uL    Eosinophils Relative 1  0 - 5 %    Eosinophils Absolute 0.1  0.0 - 0.7 K/uL    Basophils Relative 1  0 - 1 %    Basophils Absolute 0.1  0.0 - 0.1 K/uL   BASIC METABOLIC PANEL     Status: Abnormal   Collection Time   03/12/12  5:30 PM      Component Value Range Comment   Sodium 141  135 - 145 mEq/L    Potassium 3.7  3.5 - 5.1 mEq/L    Chloride 101  96 - 112 mEq/L    CO2 28  19 - 32 mEq/L    Glucose, Bld 96  70 - 99 mg/dL    BUN 20  6 - 23 mg/dL    Creatinine, Ser 1.61  0.50 - 1.10 mg/dL    Calcium 09.6  8.4 - 10.5 mg/dL    GFR calc non Af Amer 53 (*) >90 mL/min    GFR calc Af Amer 61 (*) >90 mL/min   URINALYSIS, ROUTINE W REFLEX MICROSCOPIC     Status: Abnormal   Collection Time   03/12/12  6:47 PM      Component Value Range Comment   Color, Urine YELLOW  YELLOW    APPearance CLEAR  CLEAR    Specific Gravity, Urine 1.010  1.005 - 1.030    pH 7.0  5.0 - 8.0    Glucose, UA NEGATIVE  NEGATIVE mg/dL    Hgb urine dipstick NEGATIVE  NEGATIVE    Bilirubin Urine NEGATIVE  NEGATIVE    Ketones,  ur NEGATIVE  NEGATIVE mg/dL    Protein, ur NEGATIVE  NEGATIVE mg/dL    Urobilinogen, UA 0.2  0.0 - 1.0 mg/dL    Nitrite NEGATIVE  NEGATIVE    Leukocytes, UA SMALL (*) NEGATIVE   URINE MICROSCOPIC-ADD ON     Status: Normal   Collection Time   03/12/12  6:47 PM      Component Value Range Comment   Squamous Epithelial / LPF RARE  RARE    WBC, UA 3-6  <3 WBC/hpf    Ct Head Wo Contrast  03/12/2012  *RADIOLOGY REPORT*  Clinical Data: Extremity weakness.  CT HEAD WITHOUT CONTRAST  Technique:  Contiguous axial images were obtained from the base of the skull through the vertex without contrast.  Comparison: None.  Findings: No evidence for acute hemorrhage, mass lesion, midline shift, hydrocephalus or large infarct.  Patchy areas of low density in the subcortical white matter, particularly in the parietal lobes.  The visualized sinuses are clear.  No acute osseous abnormality.  IMPRESSION: No acute intracranial abnormality.  Patchy areas of low density within the white matter.  Findings most likely represent chronic small vessel ischemic changes.  Original Report Authenticated By: Richarda Overlie, M.D.   Mr Angiogram Head Wo Contrast  03/12/2012  *RADIOLOGY REPORT*  Clinical Data:  Left arm weakness.  Dysmetria.  MRI HEAD WITHOUT CONTRAST MRA HEAD WITHOUT CONTRAST  Technique:  Multiplanar, multiecho pulse sequences of the brain and surrounding structures were obtained without intravenous contrast. Angiographic images of the head were obtained using MRA technique without contrast.  Comparison:  CT head without contrast 03/12/2012.  MRI HEAD  Findings:  An acute non hemorrhagic linear cortical infarct is present in the posterior right  frontal lobe, likely along the primary motor cortex.  Focal T2 and FLAIR hyperintensities associated with this lesion, compatible with an infarct of several hours.  No mass lesion or other acute hemorrhage is present.  Confluent periventricular and scattered subcortical and T2 FLAIR  hyperintensities are present bilaterally.  Flow is present in the major intracranial arteries.  The globes and orbits are intact.  The paranasal sinuses and mastoid air cells are clear.  IMPRESSION:  1.  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 The lesion measures approximately 1.5 cm. 2.  Atrophy and extensive white matter disease.  This likely reflects the sequelae of chronic microvascular ischemia.  MRA HEAD  Findings: The left internal carotid artery is within normal limits. There is a slight downward outpouching of the right internal carotid artery at the level of the posterior communicating artery, measuring less than 1 mm.  The A1 and M1 segments are normal.  The anterior communicating artery is patent.  The MCA bifurcations are within normal limits bilaterally.  The ACA and MCA branch vessels are unremarkable.  The left vertebral artery is slightly dominant to the right.  The left PICA origin is visualized and normal.  Prominent AICA vessels are seen bilaterally.  The basilar artery is within normal limits. Both posterior cerebral arteries originate from basilar tip.  The PCA branch vessels are within normal limits bilaterally.  IMPRESSION:  1.  Tiny, less than 1 mm, right posterior communicating artery aneurysm. 2.  Otherwise unremarkable MRA circle of Willis without evidence for significant proximal stenosis, branch vessel occlusion, or other aneurysm.  Original Report Authenticated By: Jamesetta Orleans. MATTERN, M.D.   Mr Brain Wo Contrast  03/12/2012  *RADIOLOGY REPORT*  Clinical Data:  Left arm weakness.  Dysmetria.  MRI HEAD WITHOUT CONTRAST MRA HEAD WITHOUT CONTRAST  Technique:  Multiplanar, multiecho pulse sequences of the brain and surrounding structures were obtained without intravenous contrast. Angiographic images of the head were obtained using MRA technique without contrast.  Comparison:  CT head without contrast 03/12/2012.  MRI HEAD   Findings:  An acute non hemorrhagic linear cortical infarct is present in the posterior right frontal lobe, likely along the primary motor cortex.  Focal T2 and FLAIR hyperintensities associated with this lesion, compatible with an infarct of several hours.  No mass lesion or other acute hemorrhage is present.  Confluent periventricular and scattered subcortical and T2 FLAIR hyperintensities are present bilaterally.  Flow is present in the major intracranial arteries.  The globes and orbits are intact.  The paranasal sinuses and mastoid air cells are clear.  IMPRESSION:  1.  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 The lesion measures approximately 1.5 cm. 2.  Atrophy and extensive white matter disease.  This likely reflects the sequelae of chronic microvascular ischemia.  MRA HEAD  Findings: The left internal carotid artery is within normal limits. There is a slight downward outpouching of the right internal carotid artery at the level of the posterior communicating artery, measuring less than 1 mm.  The A1 and M1 segments are normal.  The anterior communicating artery is patent.  The MCA bifurcations are within normal limits bilaterally.  The ACA and MCA branch vessels are unremarkable.  The left vertebral artery is slightly dominant to the right.  The left PICA origin is visualized and normal.  Prominent AICA vessels are seen bilaterally.  The basilar artery is within normal limits.  Both posterior cerebral arteries originate from basilar tip.  The PCA branch vessels are within normal limits bilaterally.  IMPRESSION:  1.  Tiny, less than 1 mm, right posterior communicating artery aneurysm. 2.  Otherwise unremarkable MRA circle of Willis without evidence for significant proximal stenosis, branch vessel occlusion, or other aneurysm.  Original Report Authenticated By: Jamesetta Orleans. MATTERN, M.D.    Review of Systems  HENT: Negative.   Eyes: Negative.     Respiratory: Negative.   Cardiovascular: Negative.   Gastrointestinal: Negative.   Genitourinary: Negative.   Musculoskeletal: Negative.   Skin: Negative.   Neurological: Positive for tingling, focal weakness and weakness.  Endo/Heme/Allergies: Negative.   Psychiatric/Behavioral: Negative.     Blood pressure 134/61, pulse 58, temperature 98.2 F (36.8 C), temperature source Oral, resp. rate 20, height 5\' 4"  (1.626 m), weight 85.4 kg (188 lb 4.4 oz), SpO2 95.00%. Physical Exam  Constitutional: She is oriented to person, place, and time. She appears well-developed and well-nourished.  HENT:  Head: Normocephalic and atraumatic.  Right Ear: External ear normal.  Left Ear: External ear normal.  Nose: Nose normal.  Mouth/Throat: Oropharynx is clear and moist.  Eyes: Conjunctivae and EOM are normal. Pupils are equal, round, and reactive to light.  Neck: Normal range of motion. Neck supple.  Cardiovascular: Normal rate, regular rhythm, normal heart sounds and intact distal pulses.   Respiratory: Effort normal and breath sounds normal.  GI: Soft. Bowel sounds are normal.  Musculoskeletal: Normal range of motion.  Neurological: She is alert and oriented to person, place, and time. She has normal reflexes.  Skin: Skin is warm and dry.  Psychiatric: She has a normal mood and affect. Her behavior is normal. Judgment and thought content normal.     Assessment/Plan 71 year old female here with left upper extremity numbness and clumsiness probably TIA versus stroke. Head CT so far is negative and MRI has been ordered. Possibilities of a new stroke is being considered.  Plan #1 CVA: Most likely a stroke but does be a TIA. Patient to be admitted to the neuro foramen. Once he passed his swallow evaluation given aspirin. She needs to continue on a statin and fully evaluate her for possible CVA. PT and OT will also be consulted. Neurology has already been consulted but she is not a TPA  candidate.  #2 hypertension: Continue home medications. Monitor patient's blood pressure in the hospital.  #3 hyperlipidemia: We'll check fasting lipid panel and continue with high statin.  #4 prophylaxis: We'll put her on Lovenox.  Lilac Hoff,LAWAL 03/13/2012, 5:54 AM

## 2012-03-13 NOTE — Progress Notes (Signed)
Subjective: Patient is alert oriented x3, no complaint of headache dizziness or blurred vision, no new neuro deficits, no problems per nursing. Admitting H&P, PT note reviewed, neurology input appreciated  Objective: Vital signs in last 24 hours: Temp:  [97.5 F (36.4 C)-98.3 F (36.8 C)] 97.5 F (36.4 C) (08/01 1015) Pulse Rate:  [47-59] 58  (08/01 1015) Resp:  [11-20] 18  (08/01 1015) BP: (134-190)/(60-126) 154/74 mmHg (08/01 1015) SpO2:  [95 %-100 %] 95 % (08/01 1015) Weight:  [85.4 kg (188 lb 4.4 oz)] 85.4 kg (188 lb 4.4 oz) (08/01 0055) Weight change:  Last BM Date: 03/12/12  Intake/Output from previous day: 07/31 0701 - 08/01 0700 In: 240 [P.O.:240] Out: -  Intake/Output this shift: Total I/O In: 240 [P.O.:240] Out: -   General appearance: alert and cooperative Neck: no adenopathy, no carotid bruit, no JVD, supple, symmetrical, trachea midline, thyroid not enlarged, symmetric, no tenderness/mass/nodules and no carotid bruit Resp: clear to auscultation bilaterally Cardio: regular rate and rhythm, S1, S2 normal, no murmur, click, rub or gallop Neurologic: Motor: left upper extremity clumsiness, gait not tested  Lab Results:  Results for orders placed during the hospital encounter of 03/12/12 (from the past 24 hour(s))  GLUCOSE, CAPILLARY     Status: Normal   Collection Time   03/12/12  5:01 PM      Component Value Range   Glucose-Capillary 91  70 - 99 mg/dL   Comment 1 Documented in Chart     Comment 2 Notify RN    CBC WITH DIFFERENTIAL     Status: Normal   Collection Time   03/12/12  5:30 PM      Component Value Range   WBC 6.0  4.0 - 10.5 K/uL   RBC 4.16  3.87 - 5.11 MIL/uL   Hemoglobin 13.1  12.0 - 15.0 g/dL   HCT 16.1  09.6 - 04.5 %   MCV 91.6  78.0 - 100.0 fL   MCH 31.5  26.0 - 34.0 pg   MCHC 34.4  30.0 - 36.0 g/dL   RDW 40.9  81.1 - 91.4 %   Platelets 212  150 - 400 K/uL   Neutrophils Relative 55  43 - 77 %   Neutro Abs 3.3  1.7 - 7.7 K/uL   Lymphocytes Relative 33  12 - 46 %   Lymphs Abs 2.0  0.7 - 4.0 K/uL   Monocytes Relative 10  3 - 12 %   Monocytes Absolute 0.6  0.1 - 1.0 K/uL   Eosinophils Relative 1  0 - 5 %   Eosinophils Absolute 0.1  0.0 - 0.7 K/uL   Basophils Relative 1  0 - 1 %   Basophils Absolute 0.1  0.0 - 0.1 K/uL  BASIC METABOLIC PANEL     Status: Abnormal   Collection Time   03/12/12  5:30 PM      Component Value Range   Sodium 141  135 - 145 mEq/L   Potassium 3.7  3.5 - 5.1 mEq/L   Chloride 101  96 - 112 mEq/L   CO2 28  19 - 32 mEq/L   Glucose, Bld 96  70 - 99 mg/dL   BUN 20  6 - 23 mg/dL   Creatinine, Ser 7.82  0.50 - 1.10 mg/dL   Calcium 95.6  8.4 - 21.3 mg/dL   GFR calc non Af Amer 53 (*) >90 mL/min   GFR calc Af Amer 61 (*) >90 mL/min  URINALYSIS, ROUTINE W REFLEX MICROSCOPIC  Status: Abnormal   Collection Time   03/12/12  6:47 PM      Component Value Range   Color, Urine YELLOW  YELLOW   APPearance CLEAR  CLEAR   Specific Gravity, Urine 1.010  1.005 - 1.030   pH 7.0  5.0 - 8.0   Glucose, UA NEGATIVE  NEGATIVE mg/dL   Hgb urine dipstick NEGATIVE  NEGATIVE   Bilirubin Urine NEGATIVE  NEGATIVE   Ketones, ur NEGATIVE  NEGATIVE mg/dL   Protein, ur NEGATIVE  NEGATIVE mg/dL   Urobilinogen, UA 0.2  0.0 - 1.0 mg/dL   Nitrite NEGATIVE  NEGATIVE   Leukocytes, UA SMALL (*) NEGATIVE  URINE MICROSCOPIC-ADD ON     Status: Normal   Collection Time   03/12/12  6:47 PM      Component Value Range   Squamous Epithelial / LPF RARE  RARE   WBC, UA 3-6  <3 WBC/hpf  LIPID PANEL     Status: Normal   Collection Time   03/13/12  5:00 AM      Component Value Range   Cholesterol 165  0 - 200 mg/dL   Triglycerides 161  <096 mg/dL   HDL 46  >04 mg/dL   Total CHOL/HDL Ratio 3.6     VLDL 27  0 - 40 mg/dL   LDL Cholesterol 92  0 - 99 mg/dL      Studies/Results: Dg Chest 2 View  03/13/2012  *RADIOLOGY REPORT*  Clinical Data: Stroke, left arm weakness  CHEST - 2 VIEW  Comparison: None.  Findings: Normal heart  size and vascularity.  Minimal left lower lobe atelectasis / scarring.  Prominent right epicardial fat shadow along the right cardiac border.  Negative for CHF, pneumonia, collapse, consolidation, edema, or effusion.  Trachea midline.  Medial right upper lobe nodular density versus nodule measures 9 mm and projects over the posterior sixth rib shadow.  This could represent superimposed shadows versus a small nodule.  No available comparison studies.  IMPRESSION:  - Left base atelectasis versus scarring  Prominent right epicardial fat shadow  9 mm medial right upper lobe nodule.  Recommend follow-up non emergent chest CT.  Original Report Authenticated By: Judie Petit. Ruel Favors, M.D.   Ct Head Wo Contrast  03/12/2012  *RADIOLOGY REPORT*  Clinical Data: Extremity weakness.  CT HEAD WITHOUT CONTRAST  Technique:  Contiguous axial images were obtained from the base of the skull through the vertex without contrast.  Comparison: None.  Findings: No evidence for acute hemorrhage, mass lesion, midline shift, hydrocephalus or large infarct.  Patchy areas of low density in the subcortical white matter, particularly in the parietal lobes.  The visualized sinuses are clear.  No acute osseous abnormality.  IMPRESSION: No acute intracranial abnormality.  Patchy areas of low density within the white matter.  Findings most likely represent chronic small vessel ischemic changes.  Original Report Authenticated By: Richarda Overlie, M.D.   Mr Angiogram Head Wo Contrast  03/12/2012  *RADIOLOGY REPORT*  Clinical Data:  Left arm weakness.  Dysmetria.  MRI HEAD WITHOUT CONTRAST MRA HEAD WITHOUT CONTRAST  Technique:  Multiplanar, multiecho pulse sequences of the brain and surrounding structures were obtained without intravenous contrast. Angiographic images of the head were obtained using MRA technique without contrast.  Comparison:  CT head without contrast 03/12/2012.  MRI HEAD  Findings:  An acute non hemorrhagic linear cortical infarct is  present in the posterior right frontal lobe, likely along the primary motor cortex.  Focal T2 and FLAIR hyperintensities  associated with this lesion, compatible with an infarct of several hours.  No mass lesion or other acute hemorrhage is present.  Confluent periventricular and scattered subcortical and T2 FLAIR hyperintensities are present bilaterally.  Flow is present in the major intracranial arteries.  The globes and orbits are intact.  The paranasal sinuses and mastoid air cells are clear.  IMPRESSION:  1.  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 The lesion measures approximately 1.5 cm. 2.  Atrophy and extensive white matter disease.  This likely reflects the sequelae of chronic microvascular ischemia.  MRA HEAD  Findings: The left internal carotid artery is within normal limits. There is a slight downward outpouching of the right internal carotid artery at the level of the posterior communicating artery, measuring less than 1 mm.  The A1 and M1 segments are normal.  The anterior communicating artery is patent.  The MCA bifurcations are within normal limits bilaterally.  The ACA and MCA branch vessels are unremarkable.  The left vertebral artery is slightly dominant to the right.  The left PICA origin is visualized and normal.  Prominent AICA vessels are seen bilaterally.  The basilar artery is within normal limits. Both posterior cerebral arteries originate from basilar tip.  The PCA branch vessels are within normal limits bilaterally.  IMPRESSION:  1.  Tiny, less than 1 mm, right posterior communicating artery aneurysm. 2.  Otherwise unremarkable MRA circle of Willis without evidence for significant proximal stenosis, branch vessel occlusion, or other aneurysm.  Original Report Authenticated By: Jamesetta Orleans. MATTERN, M.D.   Mr Brain Wo Contrast  03/12/2012  *RADIOLOGY REPORT*  Clinical Data:  Left arm weakness.  Dysmetria.  MRI HEAD WITHOUT  CONTRAST MRA HEAD WITHOUT CONTRAST  Technique:  Multiplanar, multiecho pulse sequences of the brain and surrounding structures were obtained without intravenous contrast. Angiographic images of the head were obtained using MRA technique without contrast.  Comparison:  CT head without contrast 03/12/2012.  MRI HEAD  Findings:  An acute non hemorrhagic linear cortical infarct is present in the posterior right frontal lobe, likely along the primary motor cortex.  Focal T2 and FLAIR hyperintensities associated with this lesion, compatible with an infarct of several hours.  No mass lesion or other acute hemorrhage is present.  Confluent periventricular and scattered subcortical and T2 FLAIR hyperintensities are present bilaterally.  Flow is present in the major intracranial arteries.  The globes and orbits are intact.  The paranasal sinuses and mastoid air cells are clear.  IMPRESSION:  1.  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 The lesion measures approximately 1.5 cm. 2.  Atrophy and extensive white matter disease.  This likely reflects the sequelae of chronic microvascular ischemia.  MRA HEAD  Findings: The left internal carotid artery is within normal limits. There is a slight downward outpouching of the right internal carotid artery at the level of the posterior communicating artery, measuring less than 1 mm.  The A1 and M1 segments are normal.  The anterior communicating artery is patent.  The MCA bifurcations are within normal limits bilaterally.  The ACA and MCA branch vessels are unremarkable.  The left vertebral artery is slightly dominant to the right.  The left PICA origin is visualized and normal.  Prominent AICA vessels are seen bilaterally.  The basilar artery is within normal limits. Both posterior cerebral arteries originate from basilar tip.  The PCA branch vessels are within  normal limits bilaterally.  IMPRESSION:  1.  Tiny, less than 1 mm,  right posterior communicating artery aneurysm. 2.  Otherwise unremarkable MRA circle of Willis without evidence for significant proximal stenosis, branch vessel occlusion, or other aneurysm.  Original Report Authenticated By: Jamesetta Orleans. MATTERN, M.D.    Medications:  Prior to Admission:  Prescriptions prior to admission  Medication Sig Dispense Refill  . aspirin EC 81 MG tablet Take 81 mg by mouth daily.      Marland Kitchen atenolol (TENORMIN) 50 MG tablet Take 50 mg by mouth daily.      . calcium-vitamin D (OSCAL WITH D) 500-200 MG-UNIT per tablet Take 1 tablet by mouth daily.      . Multiple Vitamin (MULITIVITAMIN WITH MINERALS) TABS Take 1 tablet by mouth daily.      . rosuvastatin (CRESTOR) 10 MG tablet Take 5 mg by mouth daily.      . valsartan-hydrochlorothiazide (DIOVAN-HCT) 320-25 MG per tablet Take 1 tablet by mouth daily.       Scheduled:   . aspirin  300 mg Rectal Daily   Or  . aspirin  325 mg Oral Daily  . atenolol  50 mg Oral Daily  . atorvastatin  10 mg Oral q1800  . calcium-vitamin D  1 tablet Oral Q breakfast  . enoxaparin  40 mg Subcutaneous Daily  . irbesartan  300 mg Oral Daily   And  . hydrochlorothiazide  25 mg Oral Daily  . multivitamin with minerals  1 tablet Oral Daily  . DISCONTD: aspirin EC  81 mg Oral Daily  . DISCONTD: valsartan-hydrochlorothiazide  1 tablet Oral Daily   Continuous:   . sodium chloride 100 mL/hr at 03/13/12 0112  . 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 , currently with left upper extremity clumsiness, , carotid Doppler and echo report pending. Physical therapy note reviewed, awaiting occupational therapy input. Patient will be converted from aspirin to Plavix, continue aggressive risk factor modification Hypertension Hypercholesterolemia Overweight  A 9 mm right upper lobe nodule was noted on x-ray,  outpatient CT will be obtained  LOS: 1 day   Marguarite Markov D 03/13/2012, 1:04 PM

## 2012-03-13 NOTE — Progress Notes (Signed)
RN notified of abnormal BP and pulse

## 2012-03-13 NOTE — Evaluation (Signed)
Physical Therapy Evaluation Patient Details Name: Meredith Huff MRN: [id number] DOB: 1940-09-29 Today's Date: 03/13/2012 Time: 4401-0272 PT Time Calculation (min): 21 min  PT Assessment / Plan / Recommendation Clinical Impression  Meredith Huff is 71 y/o female admitted for LUE weakness and coordination deficits. 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  Presents to physical therapy today with noted LUE deficits but baseline with gait and balance. No further PT needs at this time, OT to address LUE. Pt educated on modifable risk factors for CVA, sxs of CVA and importance of seeking medical attention early should she notice any onset of these symptoms.     PT Assessment  Patent does not need any further PT services    Follow Up Recommendations  No PT follow up    Barriers to Discharge        Equipment Recommendations  None recommended by PT    Recommendations for Other Services     Frequency      Precautions / Restrictions Precautions Precautions: None         Mobility  Bed Mobility Bed Mobility: Not assessed Details for Bed Mobility Assistance: pt sitting upright in reciner Transfers Transfers: Sit to Stand;Stand to Sit Sit to Stand: 6: Modified independent (Device/Increase time);With upper extremity assist;From chair/3-in-1;With armrests Stand to Sit: 6: Modified independent (Device/Increase time);With upper extremity assist;To chair/3-in-1;With armrests Ambulation/Gait Ambulation/Gait Assistance: 6: Modified independent (Device/Increase time) Ambulation Distance (Feet): 400 Feet Assistive device: None Ambulation/Gait Assistance Details: ambulates with wide BOS and decreased step length, pt reporting this is baseline for her Stairs: Yes Stairs Assistance: 7: Independent Stair Management Technique: No rails Number of Stairs: 2     Exercises      PT Goals    Visit  Information  Last PT Received On: 03/13/12 Assistance Needed: +1    Subjective Data  Subjective: I woke up and I couldn't control my arm.  Patient Stated Goal: home   Prior Functioning  Home Living Lives With: Spouse Available Help at Discharge: Family;Available 24 hours/day Type of Home: House Home Access: Stairs to enter [employer] of Steps: 3 Entrance Stairs-Rails: Left Home Layout: One level Bathroom Shower/Tub: Tub/shower unit;Walk-in shower Bathroom Toilet: Handicapped height Bathroom Accessibility: Yes How Accessible: Accessible via walker Home Adaptive Equipment: None Prior Function Level of Independence: Independent Able to Take Stairs?: Yes Driving: Yes Vocation: Retired Dominant Hand: Left    Cognition  Overall Cognitive Status: Appears within functional limits for tasks assessed/performed Arousal/Alertness: Awake/alert Orientation Level: Appears intact for tasks assessed Behavior During Session: [facility] for tasks performed    Extremity/Trunk Assessment Right Lower Extremity Assessment RLE ROM/Strength/Tone: Within functional levels RLE Sensation: WFL - Light Touch;WFL - Proprioception RLE Coordination: WFL - gross/fine motor Left Lower Extremity Assessment LLE ROM/Strength/Tone: Within functional levels LLE Sensation: WFL - Light Touch;WFL - Proprioception LLE Coordination: WFL - gross/fine motor Trunk Assessment Trunk Assessment: Normal   Balance Standardized Balance Assessment Standardized Balance Assessment: Dynamic Gait Index Dynamic Gait Index Level Surface: Normal Change in Gait Speed: Normal Gait with Horizontal Head Turns: Mild Impairment Gait with Vertical Head Turns: Mild Impairment Gait and Pivot Turn: Mild Impairment Step Over Obstacle: Normal Step Around Obstacles: Mild Impairment Steps: Normal Total Score: 20  High Level Balance High Level Balance Activites: Side stepping;Backward walking;Direction changes;Sudden stops;Head  turns;Turns High Level Balance Comments: Pt able to perform above activites as well as picking up objects  off the floor while ambulating without difficulty  End of Session PT - End of Session Equipment Utilized During Treatment: Gait belt Activity Tolerance: Patient tolerated treatment well Patient left: in chair Nurse Communication: Mobility status  GP     [facility] HELEN 03/13/2012, 11:22 AM

## 2012-03-13 NOTE — Progress Notes (Signed)
  Echocardiogram 2D Echocardiogram has been performed.  Meredith Huff 03/13/2012, 2:43 PM

## 2012-03-13 NOTE — Progress Notes (Signed)
RN was notified of abnormal BP and pulse  

## 2012-03-13 NOTE — Progress Notes (Signed)
RN was notified of abnormal pulse and BP 

## 2012-03-13 NOTE — Progress Notes (Signed)
Stroke Team Progress Note  HISTORY Meredith Huff is a 71 year old left-handed white female with a history of hypertension and dyslipidemia. This patient was in good health, last seen normal around 9:30 PM on 03/11/2012. The patient went to bed at that time, and she got out of bed around 6 AM, and realized that the left arm was weak and clumsy. The left felt heavy, but she denied any problems with the legs. The patient had no numbness, visual disturbance, slurred speech, problems swallowing. The patient did report a mild headache. The patient went to work out, and noted that she had some balance problems on the elliptical machine. The patient eventually went to the emergency room as the left arm clumsiness did not improve. The patient denies any prior history of weakness or clumsiness. The patient has undergone a CT scan of the head that was unremarkable. NIH stroke scale score is 2. The patient is not a TPA candidate secondary to the duration of the symptoms. The patient was on low-dose aspirin prior to admission.  SUBJECTIVE  Family is at the bedside. Patient lying in bed comfortably  Overall she feels her condition is stable.   OBJECTIVE Most recent Vital Signs: Filed Vitals:   03/13/12 0245 03/13/12 0441 03/13/12 0619 03/13/12 0827  BP: 174/64 134/61 149/60 183/84  Pulse: 54 58 57 59  Temp: 98 F (36.7 C) 98.2 F (36.8 C) 98.2 F (36.8 C) 98.2 F (36.8 C)  TempSrc:  Oral Oral Oral  Resp: 18 20 18 18   Height:      Weight:      SpO2: 97% 95% 95% 95%   CBG (last 3)   Basename 03/12/12 1701  GLUCAP 91   Intake/Output from previous day: 07/31 0701 - 08/01 0700 In: 240 [P.O.:240] Out: -   IV Fluid Intake:     . sodium chloride 100 mL/hr at 03/13/12 0112  . DISCONTD: sodium chloride 125 mL/hr (03/12/12 1829)    MEDICATIONS    . aspirin  300 mg Rectal Daily   Or  . aspirin  325 mg Oral Daily  . atenolol  50 mg Oral Daily  . atorvastatin  10 mg Oral q1800  . calcium-vitamin D   1 tablet Oral Q breakfast  . enoxaparin  40 mg Subcutaneous Daily  . irbesartan  300 mg Oral Daily   And  . hydrochlorothiazide  25 mg Oral Daily  . multivitamin with minerals  1 tablet Oral Daily  . DISCONTD: aspirin EC  81 mg Oral Daily  . DISCONTD: valsartan-hydrochlorothiazide  1 tablet Oral Daily   PRN:  ondansetron (ZOFRAN) IV, senna-docusate  Diet:  Cardiac thin liquids Activity:  Bedrest DVT Prophylaxis:  lovenox  CLINICALLY SIGNIFICANT STUDIES Basic Metabolic Panel:  Lab 03/12/12 2952  NA 141  K 3.7  CL 101  CO2 28  GLUCOSE 96  BUN 20  CREATININE 1.05  CALCIUM 10.2  MG --  PHOS --    CBC:  Lab 03/12/12 1730  WBC 6.0  NEUTROABS 3.3  HGB 13.1  HCT 38.1  MCV 91.6  PLT 212    Urinalysis:  Lab 03/12/12 1847  COLORURINE YELLOW  LABSPEC 1.010  PHURINE 7.0  GLUCOSEU NEGATIVE  HGBUR NEGATIVE  BILIRUBINUR NEGATIVE  KETONESUR NEGATIVE  PROTEINUR NEGATIVE  UROBILINOGEN 0.2  NITRITE NEGATIVE  LEUKOCYTESUR SMALL*   Lipid Panel    Component Value Date/Time   CHOL 165 03/13/2012 0500   TRIG 135 03/13/2012 0500   HDL 46 03/13/2012 0500  CHOLHDL 3.6 03/13/2012 0500   VLDL 27 03/13/2012 0500   LDLCALC 92 03/13/2012 0500   HGBA1C-Pending  Dg Chest 2 View 03/13/2012  *RADIOLOGY REPORT*  Clinical Data: Stroke, left arm weakness  CHEST - 2 VIEW  Comparison: None.  Findings:  Left base atelectasis versus scarring  Prominent right epicardial fat shadow  9 mm medial right upper lobe nodule.  Recommend follow-up non emergent chest CT.  Ct Head Wo Contrast 03/12/2012  No acute intracranial abnormality.  Patchy areas of low density within the white matter.  Findings most likely represent chronic small vessel ischemic changes.    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 The lesion measures approximately 1.5 cm.   Tiny, less than 1 mm, right posterior communicating artery  aneurysm. 2.  Otherwise unremarkable MRA circle of Willis without evidence for significant proximal stenosis, branch vessel occlusion, or other aneurysm.     CT of the brain  Patchy areas of low density within the white matter. Findings most likely represent chronic small vessel ischemic changes.     2D Echocardiogram----  Carotid Doppler----  EKG  Sinus bradycardia  Therapy Recommendations -None, signed off.  Physical Exam obese middle aged african American lady not in distress.Awake alert. Afebrile. Head is nontraumatic. Neck is supple without bruit. Hearing is normal. Cardiac exam no murmur or gallop. Lungs are clear to auscultation. Distal pulses are well felt.  Neurological Exam : Awake alert oriented x 3 normal speech and language.Fundi not visualized. Visual acuity and fields are normal.  Mild left lower face asymmetry. Tongue midline. No drift. Mild diminished fine finger movements on left. Orbits right over left upper extremity. Mild left grip weak.. Normal sensation . Normal coordination.  ASSESSMENT Meredith Huff is a 71 y.o. 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 Now on aspirin 325 mg orally every day for secondary stroke prevention. Due to stroke, will switch to long term plavix for management. Patient with resultant left arm weakness.  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 The lesion measures approximately 1.5 cm.  -Hypertension -Dyslipidemia  Hospital day # 1  TREATMENT/PLAN -Change to  clopidogrel 75 mg orally every day for secondary stroke prevention. -Aggressive management of stroke risk factors. (LDL 92) -Ambulate -Echo, Carotids pending  Guy Franco, PAC,  MBA, MHA [facility] Pager: [phone] 03/13/2012 9:20 AM  Scribe for Dr. Delia Heady, Stroke Center Medical Director. He has personally reviewed chart, pertinent data, examined the patient and developed the plan of care. Pager:  [phone]

## 2012-03-14 MED ORDER — CLOPIDOGREL BISULFATE 75 MG PO TABS
75.0000 mg | ORAL_TABLET | Freq: Every day | ORAL | Status: DC
  Administered 2012-03-14: 75 mg via ORAL
  Filled 2012-03-14: qty 1

## 2012-03-14 MED ORDER — CLOPIDOGREL BISULFATE 75 MG PO TABS: 75.0000 mg | ORAL_TABLET | Freq: Every day | ORAL | Status: AC

## 2012-03-14 NOTE — Progress Notes (Signed)
Stroke Team Progress Note  HISTORY Meredith Huff is a 71 year old left-handed white female with a history of hypertension and dyslipidemia. This patient was in good health, last seen normal around 9:30 PM on 03/11/2012. The patient went to bed at that time, and she got out of bed around 6 AM, and realized that the left arm was weak and clumsy. The left felt heavy, but she denied any problems with the legs. The patient had no numbness, visual disturbance, slurred speech, problems swallowing. The patient did report a mild headache. The patient went to work out, and noted that she had some balance problems on the elliptical machine. The patient eventually went to the emergency room as the left arm clumsiness did not improve. The patient denies any prior history of weakness or clumsiness. The patient has undergone a CT scan of the head that was unremarkable. NIH stroke scale score is 2. The patient is not a TPA candidate secondary to the duration of the symptoms. The patient was on low-dose aspirin prior to admission.  SUBJECTIVE  Family is at the bedside. Patient lying in bed comfortably  Overall she feels her condition is stable.   OBJECTIVE Most recent Vital Signs: Filed Vitals:   03/13/12 1753 03/13/12 2200 03/14/12 0200 03/14/12 0600  BP: 123/63 158/89 148/68 142/70  Pulse: 60 53 49 53  Temp: 97.7 F (36.5 C) 97.6 F (36.4 C) 97.8 F (36.6 C) 97.7 F (36.5 C)  TempSrc: Oral     Resp: 18 16 16 16   Height:      Weight:      SpO2: 98% 97% 94% 95%   CBG (last 3)   Basename 03/12/12 1701  GLUCAP 91   Intake/Output from previous day: 08/01 0701 - 08/02 0700 In: 1200 [P.O.:1200] Out: -   IV Fluid Intake:      . DISCONTD: sodium chloride 100 mL/hr at 03/13/12 0112    MEDICATIONS     . aspirin  300 mg Rectal Daily   Or  . aspirin  325 mg Oral Daily  . atenolol  50 mg Oral Daily  . atorvastatin  10 mg Oral q1800  . calcium-vitamin D  1 tablet Oral Q breakfast  . enoxaparin  40  mg Subcutaneous Daily  . irbesartan  300 mg Oral Daily   And  . hydrochlorothiazide  25 mg Oral Daily  . multivitamin with minerals  1 tablet Oral Daily   PRN:  ondansetron (ZOFRAN) IV, senna-docusate  Diet:  Cardiac thin liquids Activity:  Bedrest DVT Prophylaxis:  lovenox  CLINICALLY SIGNIFICANT STUDIES Basic Metabolic Panel:   Lab 03/12/12 1730  NA 141  K 3.7  CL 101  CO2 28  GLUCOSE 96  BUN 20  CREATININE 1.05  CALCIUM 10.2  MG --  PHOS --    CBC:   Lab 03/12/12 1730  WBC 6.0  NEUTROABS 3.3  HGB 13.1  HCT 38.1  MCV 91.6  PLT 212    Urinalysis:   Lab 03/12/12 1847  COLORURINE YELLOW  LABSPEC 1.010  PHURINE 7.0  GLUCOSEU NEGATIVE  HGBUR NEGATIVE  BILIRUBINUR NEGATIVE  KETONESUR NEGATIVE  PROTEINUR NEGATIVE  UROBILINOGEN 0.2  NITRITE NEGATIVE  LEUKOCYTESUR SMALL*   Lipid Panel    Component Value Date/Time   CHOL 165 03/13/2012 0500   TRIG 135 03/13/2012 0500   HDL 46 03/13/2012 0500   CHOLHDL 3.6 03/13/2012 0500   VLDL 27 03/13/2012 0500   LDLCALC 92 03/13/2012 0500   HGBA1C-Pending  Dg Chest  2 View 03/13/2012  *RADIOLOGY REPORT*  Clinical Data: Stroke, left arm weakness  CHEST - 2 VIEW  Comparison: None.  Findings:  Left base atelectasis versus scarring  Prominent right epicardial fat shadow  9 mm medial right upper lobe nodule.  Recommend follow-up non emergent chest CT.  Ct Head Wo Contrast 03/12/2012  No acute intracranial abnormality.  Patchy areas of low density within the white matter.  Findings most likely represent chronic small vessel ischemic changes.    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 The lesion measures approximately 1.5 cm.   Tiny, less than 1 mm, right posterior communicating artery aneurysm. 2.  Otherwise unremarkable MRA circle of Willis without evidence for significant proximal stenosis, branch vessel occlusion, or other  aneurysm.     CT of the brain  Patchy areas of low density within the white matter. Findings most likely represent chronic small vessel ischemic changes.     2D Echocardiogram EF 55%, no cardiac source of emboli  Carotid Doppler No significant ICA stenosis  EKG  Sinus bradycardia  Therapy Recommendations -None, signed off.  Physical Exam obese middle aged african American lady not in distress.Awake alert. Afebrile. Head is nontraumatic. Neck is supple without bruit. Hearing is normal. Cardiac exam no murmur or gallop. Lungs are clear to auscultation. Distal pulses are well felt.  Neurological Exam : Awake alert oriented x 3 normal speech and language.Fundi not visualized. Visual acuity and fields are normal.  Mild left lower face asymmetry. Tongue midline. No drift. Mild diminished fine finger movements on left. Orbits right over left upper extremity. Mild left grip weak.. Normal sensation . Normal coordination.  ASSESSMENT Meredith Huff is a 71 y.o. 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 Now on Plavix for secondary stroke prevention. Due to stroke, will switch to long term plavix for management. Patient with resultant left arm weakness.  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 The lesion measures approximately 1.5 cm.  -Hypertension -Dyslipidemia  Hospital day # 2  TREATMENT/PLAN -Change to  clopidogrel 75 mg orally every day for secondary stroke prevention. -Aggressive management of stroke risk factors. (LDL 92) -Ambulate -No further neurologic intervention is recommended at this time.  If further questions arise, please call or page at that time.  Thank you for allowing neurology to  participate in the care of this patient. -Followup with Dr. Pearlean Brownie in 2 mos    Guy Franco, [facility],  MBA, MHA [facility] Pager: [phone] 03/14/2012 9:10 AM  Scribe for Dr. Delia Heady, Stroke Center Medical Director. He has personally reviewed chart, pertinent data, examined the patient and developed the plan of care. Pager:  [phone]

## 2012-03-14 NOTE — ED Provider Notes (Signed)
I saw and evaluated the patient, reviewed the resident's note and I agree with the findings and plan.  Jerelle Virden, MD 03/14/12 0805 

## 2012-03-14 NOTE — Care Management Note (Signed)
    Page 1 of 1   03/14/2012     3:57:33 PM   CARE MANAGEMENT NOTE 03/14/2012  Patient:  Meredith Huff,Meredith Huff   Account Number:  [id number]  Date Initiated:  03/14/2012  Documentation initiated by:  Onnie Boer  Subjective/Objective Assessment:   PT WAS ADMITTED WITH CVA     Action/Plan:   PROGRESSION OF CARE AND DISCHARGE PLANNING   Anticipated DC Date:  03/14/2012   Anticipated DC Plan:  HOME/SELF CARE      DC Planning Services  CM consult      Choice offered to / List presented to:             Status of service:  Completed, signed off Medicare Important Message given?   (If response is "NO", the following Medicare IM given date fields will be blank) Date Medicare IM given:   Date Additional Medicare IM given:    Discharge Disposition:  HOME/SELF CARE  Per UR Regulation:  Reviewed for med. necessity/level of care/duration of stay  If discussed at Long Length of Stay Meetings, dates discussed:    Comments:  03/14/12 Onnie Boer, RN, BSN 1556 PT WAS ADMITTED WITH CVA AND WAS DC'D TO HOME WITH SELF CARE AND OP OT.  ORDER WAS FAXED TO [facility].

## 2012-03-14 NOTE — Discharge Summary (Signed)
Physician Discharge Summary  Patient ID: Meredith Huff MRN: [id number] DOB/AGE: Mar 20, 1941 71 y.o.  Admit date: 03/12/2012 Discharge date: 03/14/2012  Admission Diagnoses: Left upper extremity weakness  Discharge Diagnoses:  Principal Problem:  *Stroke Active Problems:  Hypertension  Dyslipidemia overweight  Discharged Condition: stable  Hospital Course:  Patient presented to the hospital with left upper extremity weakness, she awoke with left upper extremity clumsiness, continue to try to go to the gym had some unsteadiness, she was seen in the office and I recommended to go to the emergency room for prompt evaluation for CVA. In ED she was evaluated, CT of her head was negative for acute pathology, exam was consistent with CVA, admission was deemed necessary for further evaluation and treatment. Please see dictated H&P for further details of past medical history medications social history past surgical history allergies family history. Hospital course patient was admitted to a telemetry floor bed for valuation and treatment of stroke, she was seen in consultation by neurology. She had MRI/MRA, carotid Doppler, 2-D echo. MRI confirmed stroke in the right frontal motor cortex. Carotid Dopplers without stenosis, 2-D echo without embolic foci. Patient's aspirin was changed to Plavix. She was seen by physical therapy, there were no physical therapy needs however patient does require occupational therapy. Patient did have a chest x-ray which showed a nodule, she will have outpatient followup CAT scan. There were no recurrent neuro deficits while hospitalized her left upper extremity weakness has improved. Patient is felt to be medically stable for discharge to home  Consults:    Significant Diagnostic Studies:Dg Chest 2 View  03/13/2012  *RADIOLOGY REPORT*  Clinical Data: Stroke, left arm weakness  CHEST - 2 VIEW  Comparison: None.  Findings: Normal heart size and vascularity.  Minimal left lower  lobe atelectasis / scarring.  Prominent right epicardial fat shadow along the right cardiac border.  Negative for CHF, pneumonia, collapse, consolidation, edema, or effusion.  Trachea midline.  Medial right upper lobe nodular density versus nodule measures 9 mm and projects over the posterior sixth rib shadow.  This could represent superimposed shadows versus a small nodule.  No available comparison studies.  IMPRESSION:  - Left base atelectasis versus scarring  Prominent right epicardial fat shadow  9 mm medial right upper lobe nodule.  Recommend follow-up non emergent chest CT.  Original Report Authenticated By: Judie Petit. Ruel Favors, M.D.   Ct Head Wo Contrast  03/12/2012  *RADIOLOGY REPORT*  Clinical Data: Extremity weakness.  CT HEAD WITHOUT CONTRAST  Technique:  Contiguous axial images were obtained from the base of the skull through the vertex without contrast.  Comparison: None.  Findings: No evidence for acute hemorrhage, mass lesion, midline shift, hydrocephalus or large infarct.  Patchy areas of low density in the subcortical white matter, particularly in the parietal lobes.  The visualized sinuses are clear.  No acute osseous abnormality.  IMPRESSION: No acute intracranial abnormality.  Patchy areas of low density within the white matter.  Findings most likely represent chronic small vessel ischemic changes.  Original Report Authenticated By: Richarda Overlie, M.D.   Mr Angiogram Head Wo Contrast  03/12/2012  *RADIOLOGY REPORT*  Clinical Data:  Left arm weakness.  Dysmetria.  MRI HEAD WITHOUT CONTRAST MRA HEAD WITHOUT CONTRAST  Technique:  Multiplanar, multiecho pulse sequences of the brain and surrounding structures were obtained without intravenous contrast. Angiographic images of the head were obtained using MRA technique without contrast.  Comparison:  CT head without contrast 03/12/2012.  MRI HEAD  Findings:  An acute non hemorrhagic linear cortical infarct is present in the posterior right frontal lobe,  likely along the primary motor cortex.  Focal T2 and FLAIR hyperintensities associated with this lesion, compatible with an infarct of several hours.  No mass lesion or other acute hemorrhage is present.  Confluent periventricular and scattered subcortical and T2 FLAIR hyperintensities are present bilaterally.  Flow is present in the major intracranial arteries.  The globes and orbits are intact.  The paranasal sinuses and mastoid air cells are clear.  IMPRESSION:  1.  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 The lesion measures approximately 1.5 cm. 2.  Atrophy and extensive white matter disease.  This likely reflects the sequelae of chronic microvascular ischemia.  MRA HEAD  Findings: The left internal carotid artery is within normal limits. There is a slight downward outpouching of the right internal carotid artery at the level of the posterior communicating artery, measuring less than 1 mm.  The A1 and M1 segments are normal.  The anterior communicating artery is patent.  The MCA bifurcations are within normal limits bilaterally.  The ACA and MCA branch vessels are unremarkable.  The left vertebral artery is slightly dominant to the right.  The left PICA origin is visualized and normal.  Prominent AICA vessels are seen bilaterally.  The basilar artery is within normal limits. Both posterior cerebral arteries originate from basilar tip.  The PCA branch vessels are within normal limits bilaterally.  IMPRESSION:  1.  Tiny, less than 1 mm, right posterior communicating artery aneurysm. 2.  Otherwise unremarkable MRA circle of Willis without evidence for significant proximal stenosis, branch vessel occlusion, or other aneurysm.  Original Report Authenticated By: Jamesetta Orleans. MATTERN, M.D.   Mr Brain Wo Contrast  03/12/2012  *RADIOLOGY REPORT*  Clinical Data:  Left arm weakness.  Dysmetria.  MRI HEAD WITHOUT CONTRAST MRA HEAD WITHOUT CONTRAST  Technique:   Multiplanar, multiecho pulse sequences of the brain and surrounding structures were obtained without intravenous contrast. Angiographic images of the head were obtained using MRA technique without contrast.  Comparison:  CT head without contrast 03/12/2012.  MRI HEAD  Findings:  An acute non hemorrhagic linear cortical infarct is present in the posterior right frontal lobe, likely along the primary motor cortex.  Focal T2 and FLAIR hyperintensities associated with this lesion, compatible with an infarct of several hours.  No mass lesion or other acute hemorrhage is present.  Confluent periventricular and scattered subcortical and T2 FLAIR hyperintensities are present bilaterally.  Flow is present in the major intracranial arteries.  The globes and orbits are intact.  The paranasal sinuses and mastoid air cells are clear.  IMPRESSION:  1.  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 The lesion measures approximately 1.5 cm. 2.  Atrophy and extensive white matter disease.  This likely reflects the sequelae of chronic microvascular ischemia.  MRA HEAD  Findings: The left internal carotid artery is within normal limits. There is a slight downward outpouching of the right internal carotid artery at the level of the posterior communicating artery, measuring less than 1 mm.  The A1 and M1 segments are normal.  The anterior communicating artery is patent.  The MCA bifurcations are within normal limits bilaterally.  The ACA and MCA branch vessels are unremarkable.  The left vertebral artery is slightly dominant to the right.  The left PICA origin is visualized and normal.  Prominent  AICA vessels are seen bilaterally.  The basilar artery is within normal limits. Both posterior cerebral arteries originate from basilar tip.  The PCA branch vessels are within normal limits bilaterally.  IMPRESSION:  1.  Tiny, less than 1 mm, right posterior communicating artery aneurysm. 2.   Otherwise unremarkable MRA circle of Willis without evidence for significant proximal stenosis, branch vessel occlusion, or other aneurysm.  Original Report Authenticated By: Jamesetta Orleans. MATTERN, M.D.    2D ECHO ------------------------------------------------------------ Left ventricle: The cavity size was normal. There was moderate concentric hypertrophy. Systolic function was normal. The estimated ejection fraction was in the range of 55% to 60%. Wall motion was normal; there were no regional wall motion abnormalities.  Carotid Doppler Summary: No significant extracranial carotid artery stenosis demonstrated. Vertebrals are patent with antegrade flow.   Labs Hemoglobin A1c [[id number]] Abnormal Collection: 03/13/12 0500 Resulted: 03/13/12 1523 Specimen Type: Blood Hemoglobin A1C 5.8 % H Mean Plasma Glucose 120 mg/dL H Lipid panel [[id number]] Collection: 03/13/12 0500 Resulted: 03/13/12 0731 Specimen Type: Blood Cholesterol 165 mg/dL Triglycerides 147 mg/dL HDL 46 mg/dL Total CHOL/HDL Ratio 3.6 RATIO VLDL 27 mg/dL LDL Cholesterol 92 mg/dL     Discharge Exam: Blood pressure 125/78, pulse 62, temperature 98 F (36.7 C), temperature source Oral, resp. rate 18, height 5\' 4"  (1.626 m), weight 85.4 kg (188 lb 4.4 oz), SpO2 97.00%. General appearance: alert and cooperative Resp: clear to auscultation bilaterally Cardio: regular rate and rhythm, S1, S2 normal, no murmur, click, rub or gallop Neurologic: Cranial nerves: normal Motor: patient had minimal left upper extremity weakness Coordination: minimal drift left upper extremity minimal uncoordination  Disposition: 01-Home or Self Care   Medication List  As of 03/14/2012 12:26 PM   STOP taking these medications         aspirin EC 81 MG tablet         TAKE these medications         atenolol 50 MG tablet   Commonly known as: TENORMIN   Take 50 mg by mouth daily.      calcium-vitamin D 500-200 MG-UNIT per tablet   Commonly  known as: OSCAL WITH D   Take 1 tablet by mouth daily.      clopidogrel 75 MG tablet   Commonly known as: PLAVIX   Take 1 tablet (75 mg total) by mouth daily with breakfast.      multivitamin with minerals Tabs   Take 1 tablet by mouth daily.      rosuvastatin 10 MG tablet   Commonly known as: CRESTOR   Take 5 mg by mouth daily.      valsartan-hydrochlorothiazide 320-25 MG per tablet   Commonly known as: DIOVAN-HCT   Take 1 tablet by mouth daily.           Follow-up Information    Follow up with Gates Rigg, MD. Schedule an appointment as soon as possible for a visit in 2 months. (sooner as needed)    Contact information:   [address], [address] [facility] [place] [address] [phone]       Follow up with Katy Apo, MD in 1 week.   Contact information:   [address] [place] [address] [phone]          Signed: Katy Apo 03/14/2012, 12:14 PM

## 2012-03-14 NOTE — Progress Notes (Signed)
Occupational Therapy Treatment and Discharge Patient Details Name: Meredith Huff MRN: [id number] DOB: Feb 18, 1941 Today's Date: 03/14/2012 Time: 0901-0910 OT Time Calculation (min): 9 min  OT Assessment / Plan / Recommendation Comments on Treatment Session Focus of session on educating pt on HEP using theraputty for Left hand.  Continue to recommend OPOT.  No further acute OT needs.    Follow Up Recommendations  Outpatient OT    Barriers to Discharge       Equipment Recommendations  None recommended by OT    Recommendations for Other Services    Frequency     Plan Discharge plan remains appropriate;All goals met and education completed, patient discharged from OT services    Precautions / Restrictions     Pertinent Vitals/Pain See vitals    ADL  ADL Comments: Pt with increased accurace in LUE during gross and fine motor movements, but continues to require increased time. Pt reported using left hand for feeding during breakfast although will difficulty.    OT Diagnosis:    OT Problem List:   OT Treatment Interventions:     OT Goals ADL Goals Additional ADL Goal #1: Pt will incorporate LUE into 75% of ADL activities. ADL Goal: Additional Goal #1 - Progress: Met Miscellaneous OT Goals Miscellaneous OT Goal #1: Pt will independently perform LUE HEP. OT Goal: Miscellaneous Goal #1 - Progress: Met  Visit Information  Last OT Received On: 03/14/12    Subjective Data      Prior Functioning       Cognition  Overall Cognitive Status: Appears within functional limits for tasks assessed/performed Arousal/Alertness: Awake/alert Orientation Level: Appears intact for tasks assessed Behavior During Session: [facility] for tasks performed    Mobility Transfers Sit to Stand: 6: Modified independent (Device/Increase time);Other (comment) (from sofa at window) Stand to Sit: 6: Modified independent (Device/Increase time);Other (comment) (to sofa at window)   Exercises Other  Exercises Other Exercises: Provided pt with HEP handout and min resistance (yellow) theraputty.  Verbalized and demonstrated HEP regimen, and pt able to return demonstration.   Balance    End of Session OT - End of Session Activity Tolerance: Patient tolerated treatment well Patient left: Other (comment);with family/visitor present (on sofa)  GO    03/14/2012 Cipriano Mile OTR/L Pager [phone] Office [phone]  Cipriano Mile 03/14/2012, 2:45 PM

## 2012-03-15 ENCOUNTER — Other Ambulatory Visit: Payer: Medicare Other

## 2012-03-19 ENCOUNTER — Ambulatory Visit: Payer: Medicare Other | Attending: Internal Medicine | Admitting: Occupational Therapy

## 2012-03-19 DIAGNOSIS — IMO0001 Reserved for inherently not codable concepts without codable children: Secondary | ICD-10-CM | POA: Insufficient documentation

## 2012-03-19 DIAGNOSIS — I69998 Other sequelae following unspecified cerebrovascular disease: Secondary | ICD-10-CM | POA: Insufficient documentation

## 2012-03-19 DIAGNOSIS — M6281 Muscle weakness (generalized): Secondary | ICD-10-CM | POA: Insufficient documentation

## 2012-03-19 DIAGNOSIS — M25519 Pain in unspecified shoulder: Secondary | ICD-10-CM | POA: Insufficient documentation

## 2012-03-21 ENCOUNTER — Other Ambulatory Visit: Payer: Self-pay | Admitting: Internal Medicine

## 2012-03-21 DIAGNOSIS — R911 Solitary pulmonary nodule: Secondary | ICD-10-CM

## 2012-03-24 ENCOUNTER — Ambulatory Visit: Payer: Medicare Other | Admitting: *Deleted

## 2012-03-25 ENCOUNTER — Ambulatory Visit
Admission: RE | Admit: 2012-03-25 | Discharge: 2012-03-25 | Disposition: A | Discharge status: Home / Self Care | Payer: Medicare Other | Source: Ambulatory Visit | Attending: Internal Medicine | Admitting: Internal Medicine

## 2012-03-25 DIAGNOSIS — R911 Solitary pulmonary nodule: Secondary | ICD-10-CM

## 2012-03-26 ENCOUNTER — Ambulatory Visit: Payer: Medicare Other | Admitting: *Deleted

## 2012-04-01 ENCOUNTER — Encounter: Payer: Medicare Other | Admitting: Occupational Therapy

## 2012-04-03 ENCOUNTER — Encounter: Payer: Medicare Other | Admitting: Occupational Therapy

## 2012-04-08 ENCOUNTER — Encounter: Payer: Medicare Other | Admitting: Occupational Therapy

## 2012-04-10 ENCOUNTER — Encounter: Payer: Medicare Other | Admitting: Occupational Therapy

## 2012-04-15 ENCOUNTER — Encounter: Payer: Medicare Other | Admitting: Occupational Therapy

## 2012-04-17 ENCOUNTER — Encounter: Payer: Medicare Other | Admitting: Occupational Therapy

## 2012-04-22 ENCOUNTER — Encounter: Payer: Medicare Other | Admitting: Occupational Therapy

## 2012-04-24 ENCOUNTER — Encounter: Payer: Medicare Other | Admitting: *Deleted

## 2012-04-29 ENCOUNTER — Encounter: Payer: Medicare Other | Admitting: Occupational Therapy

## 2012-05-01 ENCOUNTER — Encounter: Payer: Medicare Other | Admitting: Occupational Therapy

## 2013-02-17 ENCOUNTER — Encounter: Payer: Self-pay | Admitting: Nurse Practitioner

## 2013-02-17 ENCOUNTER — Ambulatory Visit (INDEPENDENT_AMBULATORY_CARE_PROVIDER_SITE_OTHER): Payer: Medicare Other | Admitting: Nurse Practitioner

## 2013-02-17 VITALS — BP 130/74 | HR 52 | Ht 64.5 in | Wt 199.0 lb

## 2013-02-17 DIAGNOSIS — E785 Hyperlipidemia, unspecified: Secondary | ICD-10-CM

## 2013-02-17 DIAGNOSIS — I10 Essential (primary) hypertension: Secondary | ICD-10-CM

## 2013-02-17 DIAGNOSIS — I6350 Cerebral infarction due to unspecified occlusion or stenosis of unspecified cerebral artery: Secondary | ICD-10-CM | POA: Insufficient documentation

## 2013-02-17 NOTE — Progress Notes (Signed)
HPI:  Meredith Huff is a  72 year old pleasant Caucasian lady who is seen for followup. She had  hospital admission for stroke on 03/12/2012.  She woke up from sleep with left hand weakness and clumsiness.  The left hand felt heavy  but she had no trouble with walking. CT of the head was unremarkable. NIH stroke scale on admission was 2. She was not a TPA candidate secondary to duration of symptoms. She had been taking low-dose aspirin prior to admission. MRI scan of the brain confirmed a small right frontal lacunar infarct. MRA of the brain and carotid ultrasound did not reveal any high-grade stenosis. Lipid profile was normal. 2-D echo showed normal ejection fraction without cardiac source of embolism. Hemoglobin A1c was 5.8%. She was changed from aspirin to Plavix for secondary stroke prevention and advised aggressive control of blood pressure. She continues with diminished fine finger movements in the left hand. She has some mild tingling in the hand as well. She states her balance is slightly off when she is tired or tries to walk quickly. She is tolerating cllopidogrel without any bleeding, bruising or other side effects. Her blood pressure is elevated today at 160/90 in office today  She returns for followup after last visit with Dr. Pearlean Brownie 08/19/2012.  She is doing extremely well and feels she has rare tingling numbness or weakness in her hand and has made a full recovery. Blood pressure is doing much better after Dr.Polite added new medications and today it is 130/74   She  will have  lipid profile checked at annual physical next month.  She has no complaints today.   ROS:  Blurred vision, snoring  Physical Exam General: well developed, well nourished, seated, in no evident distress Head: head normocephalic and atraumatic. Oropharynx benign Neck: supple with no carotid  bruits Cardiovascular: regular rate and rhythm, no murmurs  Neurologic Exam Mental Status: Awake and fully alert. Oriented to place  and time. Follows all commands. Speech and language normal.   Cranial Nerves:  Pupils equal, briskly reactive to light. Extraocular movements full without nystagmus. Visual fields full to confrontation. Hearing intact and symmetric to finger snap. Facial sensation intact. Face, tongue, palate move normally and symmetrically. Neck flexion and extension normal.  Motor: Normal bulk and tone. Normal strength in all tested extremity muscles.No focal weakness Sensory.: intact to touch and pinprick and vibratory.  Coordination: Rapid alternating movements normal in all extremities. Finger-to-nose and heel-to-shin performed accurately bilaterally. No dysmetria Gait and Station: Arises from chair without difficulty. Stance is normal. Gait demonstrates normal stride length and balance . Able to heel, toe and mildly unsteady with tandem walk.  Reflexes: 2+ and symmetric. Toes downgoing.     ASSESSMENT: History of right frontal lacunar infarct secondary to small vessel disease with vascular risk factors of hypertension and hyperlipidemia     PLAN: Continue Plavix for secondary stroke prevention Strict control of blood pressure below 130/90 LDL cholesterol below 100 Carotid Doppler done 09/11/2012 was negative for significant stenosis involving extracranial carotid arteries bilaterally, copy to patient Continue exercise for overall health,  weight loss Followup in 6 months  Nilda Riggs, GNP-BC APRN

## 2013-02-17 NOTE — Patient Instructions (Addendum)
Continue Plavix for secondary stroke prevention Strict control of blood pressure below 130/90 LDL cholesterol below 100 Carotid Doppler done 09/11/2012 was negative for significant stenosis involving extracranial carotid arteries bilaterally, copy to patient Continue exercise for overall health,  weight loss Followup in 6 months

## 2013-08-20 ENCOUNTER — Encounter: Payer: Self-pay | Admitting: Nurse Practitioner

## 2013-08-20 ENCOUNTER — Encounter (INDEPENDENT_AMBULATORY_CARE_PROVIDER_SITE_OTHER): Payer: Self-pay

## 2013-08-20 ENCOUNTER — Ambulatory Visit (INDEPENDENT_AMBULATORY_CARE_PROVIDER_SITE_OTHER): Payer: Medicare Other | Admitting: Nurse Practitioner

## 2013-08-20 VITALS — BP 150/80 | HR 56 | Ht 65.0 in | Wt 196.0 lb

## 2013-08-20 DIAGNOSIS — I10 Essential (primary) hypertension: Secondary | ICD-10-CM

## 2013-08-20 DIAGNOSIS — E785 Hyperlipidemia, unspecified: Secondary | ICD-10-CM

## 2013-08-20 DIAGNOSIS — I6350 Cerebral infarction due to unspecified occlusion or stenosis of unspecified cerebral artery: Secondary | ICD-10-CM

## 2013-08-20 NOTE — Progress Notes (Signed)
[facility]  PATIENT: Meredith Huff DOB: 09-24-1940   REASON FOR VISIT: Follow up for stroke.   HISTORY OF PRESENT ILLNESS: Meredith Huff, 73 year old female returns for followup. She was last in our office 02/17/2013. She had a hospital admission for stroke 03/12/2012.  She is doing extremely well and feels she has rare tingling numbness or weakness in her hand and has made a full recovery. Blood pressure is doing much better.  Her lipid profile is checked by Dr. Nehemiah Huff. Her only complaint today is that her husband says she snores. She is not sure if she has any periods of apnea. She will occasionally nap in the afternoon she is retired. She returns for reevaluation.    HISTORY: She had hospital admission for stroke on 03/12/2012. She woke up from sleep with left hand weakness and clumsiness. The left hand felt heavy but she had no trouble with walking. CT of the head was unremarkable. NIH stroke scale on admission was 2. She was not a TPA candidate secondary to duration of symptoms. She had been taking low-dose aspirin prior to admission. MRI scan of the brain confirmed a small right frontal lacunar infarct. MRA of the brain and carotid ultrasound did not reveal any high-grade stenosis. Lipid profile was normal. 2-D echo showed normal ejection fraction without cardiac source of embolism. Hemoglobin A1c was 5.8%. She was changed from aspirin to Plavix for secondary stroke prevention and advised aggressive control of blood pressure. She continues with diminished fine finger movements in the left hand. She has some mild tingling in the hand as well. She states her balance is slightly off when she is tired or tries to walk quickly. She is tolerating cllopidogrel without any bleeding, bruising or other side effects. Her blood pressure is elevated today at 160/90 in office today       REVIEW OF SYSTEMS: Full 14 system review of systems performed and notable only for those listed, all  others are neg:  Constitutional: N/A  Cardiovascular: N/A  Ear/Nose/Throat: N/A  Skin: N/A  Eyes: N/A  Respiratory: N/A  Gastroitestinal: N/A  Hematology/Lymphatic: N/A  Endocrine: N/A Musculoskeletal:N/A  Allergy/Immunology: N/A  Neurological: N/A Psychiatric: N/A Sleep snoring   ALLERGIES: No Known Allergies  HOME MEDICATIONS: Outpatient Prescriptions Prior to Visit  Medication Sig Dispense Refill  . atenolol (TENORMIN) 50 MG tablet Take 100 mg by mouth daily.       . calcium-vitamin D (OSCAL WITH D) 500-200 MG-UNIT per tablet Take 1 tablet by mouth daily.      . Multiple Vitamin (MULITIVITAMIN WITH MINERALS) TABS Take 1 tablet by mouth daily.      . rosuvastatin (CRESTOR) 10 MG tablet Take 5 mg by mouth daily.      . valsartan-hydrochlorothiazide (DIOVAN-HCT) 320-25 MG per tablet Take 1 tablet by mouth daily.       No facility-administered medications prior to visit.    PAST MEDICAL HISTORY: Past Medical History  Diagnosis Date  . Hypertension   . Hypercholesteremia   . Stroke 03/12/12    "can't control left arm"    PAST SURGICAL HISTORY: Past Surgical History  Procedure Laterality Date  . Vaginal hysterectomy  1980's  . Breast lumpectomy  1970's    right    FAMILY HISTORY: Family History  Problem Relation Age of Onset  . Heart attack Father   . Dementia Mother     Stroke, heart disease    SOCIAL HISTORY: History   Social History  . Marital  Status: Married    Spouse Name: Meredith AguasHoward    Number of Children: 2  . Years of Education: 8th grade   Occupational History  . Retired    Social History Main Topics  . Smoking status: Never Smoker   . Smokeless tobacco: Never Used  . Alcohol Use: No  . Drug Use: No  . Sexual Activity: Not Currently   Other Topics Concern  . Not on file   Social History Narrative   Patient lives at home with husband Meredith Huff(Meredith Huff)   Patient is left handed   Patient educational level is 8 th grade   Caffeine consumption is  10 cups of coffee or more           PHYSICAL EXAM  Filed Vitals:   08/20/13 1444  BP: 173/86  Pulse: 56  Height: 5\' 5"  (1.651 m)  Weight: 196 lb (88.905 kg)   Body mass index is 32.62 kg/(m^2).  Generalized: Well developed, in no acute distress  Head: normocephalic and atraumatic,. Oropharynx benign  Neck: Supple, no carotid bruits , neck size 15.5 inches Cardiac: Regular rate rhythm, no murmur  Musculoskeletal: No deformity   Neurological examination   Mentation: Alert oriented to time, place, history taking. Follows all commands speech and language fluent. EES 9.   Cranial nerve II-XII: Pupils were equal round reactive to light extraocular movements were full, visual field were full on confrontational test. Facial sensation and strength were normal. hearing was intact to finger rubbing bilaterally. Uvula tongue midline. head turning and shoulder shrug were normal and symmetric.Tongue protrusion into cheek strength was normal. Motor: normal bulk and tone, full strength in the BUE, BLE, fine finger movements normal, no pronator drift. No focal weakness Sensory: normal and symmetric to light touch, pinprick, and  vibration  Coordination: finger-nose-finger, heel-to-shin bilaterally, no dysmetria Reflexes: Brachioradialis 2/2, biceps 2/2, triceps 2/2, patellar 2/2, Achilles 2/2, plantar responses were flexor bilaterally. Gait and Station: Rising up from seated position without assistance, normal stance,  moderate stride, good arm swing, smooth turning, able to perform tiptoe, and heel walking without difficulty. Tandem gait is unsteady  DIAGNOSTIC DATA (LABS, IMAGING, TESTING) -None to review  ASSESSMENT AND PLAN  73 y.o. year old female  has a past medical history of Hypertension; Hypercholesteremia; and Stroke (03/12/12). here to followup. New complaints of snoring questionable apnea episodes.  Will set up for sleep study with hx of stroke Repeat carotid doppler compare to  Jan 2014 Continue Plavix for secondary stroke prevention Followup in 6 months Meredith Huff, [facility] IncGNP, [facility], APRN  [facility] [address], [address] [place], [place] [id number] [phone]

## 2013-08-20 NOTE — Patient Instructions (Signed)
Will set up for sleep study Repeat carotid doppler compare to Jan 2014 Continue Plavix for secondary stroke prevention Followup in 6 months

## 2013-08-26 ENCOUNTER — Telehealth: Payer: Self-pay | Admitting: Neurology

## 2013-08-26 DIAGNOSIS — G4730 Sleep apnea, unspecified: Secondary | ICD-10-CM

## 2013-08-26 DIAGNOSIS — I639 Cerebral infarction, unspecified: Secondary | ICD-10-CM

## 2013-08-26 DIAGNOSIS — R0683 Snoring: Secondary | ICD-10-CM

## 2013-08-26 DIAGNOSIS — R400 Somnolence: Secondary | ICD-10-CM

## 2013-08-26 NOTE — Telephone Encounter (Signed)
. °  Meredith Angelarolyn Martin, NP is referring Meredith Huff, 73 y.o. female, for an attended sleep study.  Wt: 196 lbs. Ht: 65 in. BMI: 32.62  Diagnoses: Hx of Stroke - 02/2012 Excessive Daytime Sleepiness Snoring Witnessed Apneas (possible) HTN Hypercholesterolemia Obesity  Medication List: Current Outpatient Prescriptions  Medication Sig Dispense Refill   amLODipine (NORVASC) 5 MG tablet        atenolol (TENORMIN) 50 MG tablet Take 100 mg by mouth daily.        calcium-vitamin D (OSCAL WITH D) 500-200 MG-UNIT per tablet Take 1 tablet by mouth daily.       clopidogrel (PLAVIX) 75 MG tablet        Multiple Vitamin (MULITIVITAMIN WITH MINERALS) TABS Take 1 tablet by mouth daily.       rosuvastatin (CRESTOR) 10 MG tablet Take 5 mg by mouth daily.       traMADol (ULTRAM) 50 MG tablet        valsartan-hydrochlorothiazide (DIOVAN-HCT) 320-25 MG per tablet Take 1 tablet by mouth daily.       No current facility-administered medications for this visit.    This patient presents to Meredith Angelarolyn Martin, NP in follow up for stroke.  The patient reports daytime sleepiness because she has trouble sleeping through the night.  She wakes up each hour of the night for unexplained reasons - she endorses [place] at 8; Her husband notes that the patient does snore but they are unaware of apneic events.  She has hypertension and hypercholesterolemia.  Meredith Angelarolyn Martin, NP would like the patient to have an attended sleep study for secondary stroke prevention.  Insurance:  [employer]

## 2013-08-27 NOTE — Telephone Encounter (Signed)
Ordered sleep study, thx s

## 2013-08-27 NOTE — Sleep Study (Signed)
After reviewing the sleep study referral, I entered a split night sleep study request on this patient. This is a 73 year old gentleman with a past history of stroke, hypertension, obesity, hyperlipidemia, who reports snoring, excessive daytime somnolence and possible apneas. The patient is referred by Ms. Daphine DeutscherMartin, nurse practitioner.  Huston FoleySaima Masayo Fera, MD, PhD [facility])

## 2013-09-01 ENCOUNTER — Ambulatory Visit (INDEPENDENT_AMBULATORY_CARE_PROVIDER_SITE_OTHER): Payer: Medicare Other

## 2013-09-01 DIAGNOSIS — I6350 Cerebral infarction due to unspecified occlusion or stenosis of unspecified cerebral artery: Secondary | ICD-10-CM

## 2013-09-04 ENCOUNTER — Telehealth: Payer: Self-pay | Admitting: Nurse Practitioner

## 2013-09-04 NOTE — Telephone Encounter (Signed)
Carotid doppler without change from January 2014. Please call pt.

## 2013-09-04 NOTE — Telephone Encounter (Signed)
Called patient and spoke with patient's husband Meredith Huff, concerning patient's carotid doppler results. I inform the patient's husband that there was no changes sent the last carotid doppler that the patient had in January 2014, per [name], NP. I advised the patient's husband if the patient has any other problems, questions or concerns to call the office. Patient's husband verbalized understanding.

## 2013-09-10 NOTE — Telephone Encounter (Signed)
See result note. No cahnge from previous.

## 2013-09-20 ENCOUNTER — Ambulatory Visit (INDEPENDENT_AMBULATORY_CARE_PROVIDER_SITE_OTHER): Payer: Medicare Other | Admitting: Neurology

## 2013-09-20 DIAGNOSIS — G4730 Sleep apnea, unspecified: Secondary | ICD-10-CM

## 2013-09-20 DIAGNOSIS — R0683 Snoring: Secondary | ICD-10-CM

## 2013-09-20 DIAGNOSIS — R0989 Other specified symptoms and signs involving the circulatory and respiratory systems: Secondary | ICD-10-CM

## 2013-09-20 DIAGNOSIS — R0609 Other forms of dyspnea: Secondary | ICD-10-CM

## 2013-09-20 DIAGNOSIS — I639 Cerebral infarction, unspecified: Secondary | ICD-10-CM

## 2013-09-20 DIAGNOSIS — G479 Sleep disorder, unspecified: Secondary | ICD-10-CM

## 2013-09-20 DIAGNOSIS — R400 Somnolence: Secondary | ICD-10-CM

## 2013-09-20 DIAGNOSIS — IMO0002 Reserved for concepts with insufficient information to code with codable children: Secondary | ICD-10-CM

## 2013-09-22 NOTE — Sleep Study (Signed)
See media tab for full report  Patient did not meet Split Night criteria, therefore a diagnostic NPSG was performed.

## 2013-09-30 ENCOUNTER — Telehealth: Payer: Self-pay | Admitting: Neurology

## 2013-09-30 ENCOUNTER — Encounter: Payer: Self-pay | Admitting: *Deleted

## 2013-09-30 NOTE — Telephone Encounter (Signed)
Please call and notify the patient that the recent sleep study did not show any significant obstructive sleep apnea. Please inform patient that I would like to go over the details of the study during an appointment and arrange the appointment. Also, route or fax report to PCP and referring MD, if other than PCP.  Once you have spoken to patient, you can close this encounter.   Thanks,  Huston FoleySaima Juandavid Dallman, MD, PhD [facility])

## 2013-09-30 NOTE — Telephone Encounter (Signed)
I called and spoke with the patient about her recent sleep study test results. I informed the patient that the study revealed no significant obstructive sleep apnea and Dr. Frances FurbishAthar wants to discuss the results in detail with her. Patient has agreed on October 14, 2013 at 11:00 with an arrival time of 10:45. I will fax a copy of the report to Dr. Nehemiah SettlePolite and Darrol Angelarolyn Martin offices and mail a copy of the report to the patient along with a reminder letter.

## 2013-10-14 ENCOUNTER — Ambulatory Visit (INDEPENDENT_AMBULATORY_CARE_PROVIDER_SITE_OTHER): Payer: Medicare Other | Admitting: Neurology

## 2013-10-14 ENCOUNTER — Encounter: Payer: Self-pay | Admitting: Neurology

## 2013-10-14 VITALS — BP 145/81 | HR 53 | Temp 98.1°F | Ht 65.0 in

## 2013-10-14 DIAGNOSIS — G4700 Insomnia, unspecified: Secondary | ICD-10-CM

## 2013-10-14 DIAGNOSIS — I639 Cerebral infarction, unspecified: Secondary | ICD-10-CM

## 2013-10-14 DIAGNOSIS — R0609 Other forms of dyspnea: Secondary | ICD-10-CM

## 2013-10-14 DIAGNOSIS — R0683 Snoring: Secondary | ICD-10-CM

## 2013-10-14 DIAGNOSIS — F518 Other sleep disorders not due to a substance or known physiological condition: Secondary | ICD-10-CM

## 2013-10-14 DIAGNOSIS — I6350 Cerebral infarction due to unspecified occlusion or stenosis of unspecified cerebral artery: Secondary | ICD-10-CM

## 2013-10-14 DIAGNOSIS — E785 Hyperlipidemia, unspecified: Secondary | ICD-10-CM

## 2013-10-14 DIAGNOSIS — R0989 Other specified symptoms and signs involving the circulatory and respiratory systems: Secondary | ICD-10-CM

## 2013-10-14 DIAGNOSIS — Z72821 Inadequate sleep hygiene: Secondary | ICD-10-CM

## 2013-10-14 NOTE — Progress Notes (Signed)
Subjective:    Patient ID: Meredith Huff is a 73 y.o. female.  HPI    Meredith FoleySaima Azalynn Maxim, MD, PhD [facility] [address], [address] [address] [place], [place] [address]  Dear Meredith Huff,   I saw your patient, Meredith Huff, upon your kind request in my clinic today for initial consultation of her sleep disorder, in particular after her recent sleep study. The patient is unaccompanied today. As you know, [name] is a very friendly 73 year old right-handed woman with an underlying medical history hypertension, hyperlipidemia, stroke (posterior R frontal lobe non-hemorrhagic infarct Dx on 03/12/12), and obesity, who reported snoring and excessive daytime somnolence and poor sleep. You referred her for a sleep study and she had a baseline sleep study on 09/20/2013 which I interpreted. I went over her test results with her in detail today. Sleep efficiency was markedly reduced at 52.7% with a prolonged latency of 51 minutes and wake after sleep onset of 153 minutes with severe sleep fragmentation noted. She had a highly elevated arousal index primarily because of the respiratory effort and spontaneous arousals. She had a markedly increased percentage of stage I sleep, a high normal percentage of stage II sleep, a mildly decreased percentage of slow-wave sleep and a decreased percentage of REM sleep with prolonged REM latency. She had occasional PVCs. She had mild to moderate intermittent snoring. She had very little supine sleep and no supine REM sleep. She had a total AHI of 2.1 per hour. Baseline oxygen saturation was 91% with a nadir of 86%. Time below 88% saturation was 1 minutes and 27 seconds. She has been a poor sleeper as long as she can remember, she has not tried any OTC or prescription sleep aids. She is a non-smoker.  She is in bed by 10-11 PM and it takes her at times hours to go to sleep. She may wake up multiple times per night and sometimes she does not go back to sleep.  She watches the clock every time she wakes. She has had some mood irritability. She does not drink alcohol. She drinks coffee 2-3 cups in the morning, but sometimes as late as bedtime. There is a TV in the bedroom and her husband turns it off. She denies any significant RLS symptoms. She has a strong family history of stroke. She was started on Plavix at the time of her stroke and switch from baby aspirin at the time. She is on Crestor for cholesterol management.  Her Past Medical History Is Significant For: Past Medical History  Diagnosis Date  . Hypertension   . Hypercholesteremia   . Stroke 03/12/12    "can't control left arm"    Her Past Surgical History Is Significant For: Past Surgical History  Procedure Laterality Date  . Vaginal hysterectomy  1980's  . Breast lumpectomy  1970's    right    Her Family History Is Significant For: Family History  Problem Relation Age of Onset  . Heart attack Father   . Dementia Mother     Stroke, heart disease    Her Social History Is Significant For: History   Social History  . Marital Status: Married    Spouse Name: Meredith Huff    Number of Children: 2  . Years of Education: 8th grade   Occupational History  . Retired    Social History Main Topics  . Smoking status: Never Smoker   . Smokeless tobacco: Never Used  . Alcohol Use: No  . Drug Use:  No  . Sexual Activity: Not Currently   Other Topics Concern  . None   Social History Narrative   Patient lives at home with husband Meredith Aguas)   Patient is left handed   Patient educational level is 8 th grade   Caffeine consumption is 10 cups of coffee or more          Her Allergies Are:  No Known Allergies:   Her Current Medications Are:  Outpatient Encounter Prescriptions as of 10/14/2013  Medication Sig  . amLODipine (NORVASC) 5 MG tablet   . atenolol (TENORMIN) 50 MG tablet Take 100 mg by mouth daily.   . calcium-vitamin D (OSCAL WITH D) 500-200 MG-UNIT per tablet Take 1  tablet by mouth daily.  . clopidogrel (PLAVIX) 75 MG tablet   . Multiple Vitamin (MULITIVITAMIN WITH MINERALS) TABS Take 1 tablet by mouth daily.  . rosuvastatin (CRESTOR) 10 MG tablet Take 5 mg by mouth daily.  . traMADol (ULTRAM) 50 MG tablet   . valsartan-hydrochlorothiazide (DIOVAN-HCT) 320-25 MG per tablet Take 1 tablet by mouth daily.  :  Review of Systems:  Out of a complete 14 point review of systems, all are reviewed and negative with the exception of these symptoms as listed below:   Review of Systems  Constitutional: Negative.   HENT: Negative.   Eyes: Negative.   Respiratory: Negative.   Cardiovascular: Negative.   Gastrointestinal: Negative.   Endocrine: Negative.   Genitourinary: Negative.   Musculoskeletal: Negative.   Skin: Negative.   Allergic/Immunologic: Negative.   Neurological: Negative.   Hematological: Negative.   Psychiatric/Behavioral: Positive for sleep disturbance (snoring).    Objective:  Neurologic Exam  Physical Exam Physical Examination:   Filed Vitals:   10/14/13 1115  BP: 145/81  Pulse: 53  Temp: 98.1 F (36.7 C)    General Examination: The patient is a very pleasant 73 y.o. female in no acute distress. She appears well-developed and well-nourished and well groomed. She is overweight.   HEENT: Normocephalic, atraumatic, pupils are equal, round and reactive to light and accommodation. Funduscopic exam is normal with sharp disc margins noted. Extraocular tracking is good without limitation to gaze excursion or nystagmus noted. Normal smooth pursuit is noted. Hearing is grossly intact. Tympanic membranes are clear bilaterally. Face is symmetric with normal facial animation and normal facial sensation. Speech is clear with no dysarthria noted. There is no hypophonia. There is no lip, neck/head, jaw or voice tremor. Neck is supple with full range of passive and active motion. There are no carotid bruits on auscultation. Oropharynx exam reveals:  mild mouth dryness, adequate dental hygiene and mild airway crowding, due to longer tongue. Mallampati is class II. Tongue protrudes centrally and palate elevates symmetrically. Tonsils are small.    Chest: Clear to auscultation without wheezing, rhonchi or crackles noted.  Heart: S1+S2+0, regular and normal without murmurs, rubs or gallops noted.   Abdomen: Soft, non-tender and non-distended with normal bowel sounds appreciated on auscultation.  Extremities: There is trace pitting edema in the distal lower extremities bilaterally. Pedal pulses are intact.  Skin: Warm and dry without trophic changes noted. There are varicose veins.  Musculoskeletal: exam reveals no obvious joint deformities, tenderness or joint swelling or erythema, except mild R knee swelling and mild R knee pain.   Neurologically:  Mental status: The patient is awake, alert and oriented in all 4 spheres. Her immediate and remote memory, attention, language skills and fund of knowledge are appropriate. There is no evidence of aphasia,  agnosia, apraxia or anomia. Speech is clear with normal prosody and enunciation. Thought process is linear. Mood is normal and affect is normal.  Cranial nerves II - XII are as described above under HEENT exam. In addition: shoulder shrug is normal with equal shoulder height noted. Motor exam: Normal bulk, strength and tone is noted. There is no drift, tremor or rebound. Romberg is negative. Reflexes are 2+ throughout. Babinski: Toes are flexor bilaterally. Fine motor skills and coordination: intact with normal finger taps, normal hand movements, normal rapid alternating patting, normal foot taps and normal foot agility.  Cerebellar testing: No dysmetria or intention tremor on finger to nose testing. Heel to shin is unremarkable bilaterally. There is no truncal or gait ataxia.  Sensory exam: intact to light touch, pinprick, vibration, temperature sense and proprioception in the upper and lower  extremities.  Gait, station and balance: She stands with slight difficulty d/t knee pain. No veering to one side is noted. No leaning to one side is noted. Posture is age-appropriate and stance is narrow based. Gait shows normal stride length and normal pace. No problems turning are noted. She turns en bloc. Tandem walk is difficult for her. Intact toe and heel stance is noted.               Assessment and Plan:   In summary, KAYLYNN CHAMBLIN is a very pleasant 73 y.o.-year old female with an underlying medical history hypertension, hyperlipidemia, stroke (posterior R frontal lobe non-hemorrhagic infarct Dx on 03/12/12), and obesity, who has a history of nonrestorative sleep, poor sleep consolidation, and insomnia. She had a recent sleep study which I explained to her in detail today. While the sleep study did not show any significant obstructive sleep disordered breathing she had mild to moderate intermittent snoring and poor sleep efficiency as well as poor sleep consolidation. She has no history of RLS or significant periodic leg movements of sleep. We talked about secondary stroke prevention today. We talked about weight management. I think staying off her back for sleep and weight loss will help her snoring. I think there is improvement to be had in her sleep hygiene. We talked about improving her sleep hygiene and I advised her to try melatonin over-the-counter. Furthermore I have asked her to reduce her caffeine intake and avoid caffeine at night. I gave her instructions in writing. At this juncture, I can see her back on an as-needed basis. Wetalked about maintaining a healthy lifestyle in general. I encouraged the patient to eat healthy, exercise daily and keep well hydrated, to keep a scheduled bedtime and wake time routine, to not skip any meals and eat healthy snacks in between meals and to have protein with every meal.   Thank you very much for allowing me to participate in the care of this nice  patient. If I can be of any further assistance to you please do not hesitate to call me.  Sincerely,   Meredith Foley, MD, PhD

## 2013-10-14 NOTE — Patient Instructions (Signed)
Please remember to try to maintain good sleep hygiene, which means: Keep a regular sleep and wake schedule, try not to exercise or have a meal within 2 hours of your bedtime, try to keep your bedroom conducive for sleep, that is, cool and dark, without light distractors such as an illuminated alarm clock, and refrain from watching the clock or TV right before sleep or in the middle of the night and do not keep the TV or radio on during the night. Also, try not to use or play on electronic devices at bedtime, such as your cell phone, tablet PC or laptop. If you like to read at bedtime on an electronic device, try to dim the background light as much as possible. Do not eat in the middle of the night.   You can try Melatonin at night for sleep: take 3 to 5 mg one to 2 hours before your bedtime.   Try to lose weight, which will help with your joint pain as well and your mild to moderate snoring. Stay off your back at night.

## 2014-02-17 ENCOUNTER — Ambulatory Visit (INDEPENDENT_AMBULATORY_CARE_PROVIDER_SITE_OTHER): Payer: Medicare Other | Admitting: Nurse Practitioner

## 2014-02-17 ENCOUNTER — Encounter (INDEPENDENT_AMBULATORY_CARE_PROVIDER_SITE_OTHER): Payer: Self-pay

## 2014-02-17 ENCOUNTER — Encounter: Payer: Self-pay | Admitting: Nurse Practitioner

## 2014-02-17 VITALS — BP 132/78 | HR 62 | Ht 65.0 in | Wt 198.6 lb

## 2014-02-17 DIAGNOSIS — I10 Essential (primary) hypertension: Secondary | ICD-10-CM

## 2014-02-17 DIAGNOSIS — I6350 Cerebral infarction due to unspecified occlusion or stenosis of unspecified cerebral artery: Secondary | ICD-10-CM

## 2014-02-17 DIAGNOSIS — E785 Hyperlipidemia, unspecified: Secondary | ICD-10-CM

## 2014-02-17 DIAGNOSIS — I639 Cerebral infarction, unspecified: Secondary | ICD-10-CM

## 2014-02-17 NOTE — Patient Instructions (Addendum)
Continue Plavix for secondary stroke prevention Keep systolic blood pressure less than 130 Continue exercise 5 x week F/U 6 to 8 months

## 2014-02-17 NOTE — Progress Notes (Signed)
[facility]  PATIENT: Meredith Huff DOB: 1941-05-06   REASON FOR VISIT: follow up stroke   HISTORY OF PRESENT ILLNESS:Meredith Huff, 73 year old female returns for followup. She was last in our office 08/20/13. She had a hospital admission for stroke 03/12/2012. She is doing extremely well and feels she has rare tingling numbness or weakness in her hand and has made a full recovery. Blood pressure is doing much better. Her lipid profile is checked by Dr. Nehemiah SettlePolite. Carotid Doppler after last visit was negative for significant stenosis. She had a recent sleep study which  did not show any significant obstructive sleep disordered breathing, she had mild to moderate intermittent snoring and poor sleep efficiency as well as poor sleep consolidation. She has no history of RLS or significant periodic leg movements of sleep. She is now taking melatonin 3 mg at night and sleeping much better. She exercises 5 times a week at [employer]. She returns for reevaluation.   HISTORY: She had hospital admission for stroke on 03/12/2012. She woke up from sleep with left hand weakness and clumsiness. The left hand felt heavy but she had no trouble with walking. CT of the head was unremarkable. NIH stroke scale on admission was 2. She was not a TPA candidate secondary to duration of symptoms. She had been taking low-dose aspirin prior to admission. MRI scan of the brain confirmed a small right frontal lacunar infarct. MRA of the brain and carotid ultrasound did not reveal any high-grade stenosis. Lipid profile was normal. 2-D echo showed normal ejection fraction without cardiac source of embolism. Hemoglobin A1c was 5.8%. She was changed from aspirin to Plavix for secondary stroke prevention and advised aggressive control of blood pressure. She continues with diminished fine finger movements in the left hand. She has some mild tingling in the hand as well. She states her balance is slightly off when she is  tired or tries to walk quickly. She is tolerating cllopidogrel without any bleeding, bruising or other side effects. Her blood pressure is elevated today at 160/90 in office today      REVIEW OF SYSTEMS: Full 14 system review of systems performed and notable only for those listed, all others are neg:  Constitutional: N/A  Cardiovascular: N/A  Ear/Nose/Throat: N/A  Skin: N/A  Eyes: N/A  Respiratory: N/A  Gastroitestinal: N/A  Hematology/Lymphatic: N/A  Endocrine: N/A Musculoskeletal:N/A  Allergy/Immunology: N/A  Neurological: N/A Psychiatric: N/A Sleep : NA   ALLERGIES: No Known Allergies  HOME MEDICATIONS: Outpatient Prescriptions Prior to Visit  Medication Sig Dispense Refill  . amLODipine (NORVASC) 5 MG tablet       . atenolol (TENORMIN) 50 MG tablet Take 100 mg by mouth daily.       . calcium-vitamin D (OSCAL WITH D) 500-200 MG-UNIT per tablet Take 1 tablet by mouth daily.      . clopidogrel (PLAVIX) 75 MG tablet       . Multiple Vitamin (MULITIVITAMIN WITH MINERALS) TABS Take 1 tablet by mouth daily.      . rosuvastatin (CRESTOR) 10 MG tablet Take 5 mg by mouth daily.      . traMADol (ULTRAM) 50 MG tablet       . valsartan-hydrochlorothiazide (DIOVAN-HCT) 320-25 MG per tablet Take 1 tablet by mouth daily.       No facility-administered medications prior to visit.    PAST MEDICAL HISTORY: Past Medical History  Diagnosis Date  . Hypertension   . Hypercholesteremia   . Stroke 03/12/12    "  can't control left arm"    PAST SURGICAL HISTORY: Past Surgical History  Procedure Laterality Date  . Vaginal hysterectomy  1980's  . Breast lumpectomy  1970's    right    FAMILY HISTORY: Family History  Problem Relation Age of Onset  . Heart attack Father   . Dementia Mother     Stroke, heart disease    SOCIAL HISTORY: History   Social History  . Marital Status: Married    Spouse Name: Dimas AguasHoward    Number of Children: 2  . Years of Education: 8th grade    Occupational History  . Retired    Social History Main Topics  . Smoking status: Never Smoker   . Smokeless tobacco: Never Used  . Alcohol Use: No  . Drug Use: No  . Sexual Activity: Not Currently   Other Topics Concern  . Not on file   Social History Narrative   Patient lives at home with husband Dimas Aguas(Howard)   Patient is left handed   Patient educational level is 8 th grade   Caffeine consumption is 10 cups of coffee or more           PHYSICAL EXAM  Filed Vitals:   02/17/14 1420  BP: 132/78  Pulse: 62  Height: 5\' 5"  (1.651 m)  Weight: 198 lb 9.6 oz (90.084 kg)   Body mass index is 33.05 kg/(m^2). Generalized: Well developed, in no acute distress  Head: normocephalic and atraumatic,. Oropharynx benign  Neck: Supple, no carotid bruits  Cardiac: Regular rate rhythm, no murmur  Musculoskeletal: No deformity  Neurological examination  Mentation: Alert oriented to time, place, history taking. Follows all commands speech and language fluent.   Cranial nerve II-XII: Pupils were equal round reactive to light extraocular movements were full, visual field were full on confrontational test. Facial sensation and strength were normal. hearing was intact to finger rubbing bilaterally. Uvula tongue midline. head turning and shoulder shrug were normal and symmetric.Tongue protrusion into cheek strength was normal.  Motor: normal bulk and tone, full strength in the BUE, BLE, fine finger movements normal, no pronator drift. No focal weakness  Sensory: normal and symmetric to light touch, pinprick, and vibration  Coordination: finger-nose-finger, heel-to-shin bilaterally, no dysmetria  Reflexes: Brachioradialis 2/2, biceps 2/2, triceps 2/2, patellar 2/2, Achilles 2/2, plantar responses were flexor bilaterally.  Gait and Station: Rising up from seated position without assistance, normal stance, moderate stride, good arm swing, smooth turning, able to perform tiptoe, and heel walking without  difficulty. Tandem gait is unsteady     DIAGNOSTIC DATA (LABS, IMAGING, TESTING) -  ASSESSMENT AND PLAN  73 y.o. year old female  has a past medical history of Hypertension; Hypercholesteremia; and Stroke (03/12/12). here to followup. She is not have further stroke or TIA symptoms. Carotid Doppler after last visit was negative for significant stenosis.Sleep study   did not show any significant obstructive sleep disordered breathing, she had mild to moderate intermittent snoring and poor sleep efficiency as well as poor sleep consolidation.  Continue Plavix for secondary stroke prevention Keep systolic blood pressure less than 130 Continue exercise 5 x week F/U 6 to 8 months Nilda RiggsNancy Carolyn Martin, [facility], [place]NP, [facility], APRN  [facility] [address], [address] [place], [place] [address] [phone]

## 2014-02-21 NOTE — Progress Notes (Signed)
I agree with the above plan 

## 2014-06-30 ENCOUNTER — Encounter: Payer: Self-pay | Admitting: Neurology

## 2014-07-05 ENCOUNTER — Telehealth: Payer: Self-pay | Admitting: Neurology

## 2014-07-05 NOTE — Telephone Encounter (Signed)
Left message for patient regarding rescheduling 08/20/14 appointment per Carolyn's schedule, rescheduled for next available with [name] on 09/01/14.

## 2014-07-06 ENCOUNTER — Encounter: Payer: Self-pay | Admitting: Neurology

## 2014-08-20 ENCOUNTER — Ambulatory Visit: Payer: Medicare Other | Admitting: Nurse Practitioner

## 2014-09-01 ENCOUNTER — Ambulatory Visit (INDEPENDENT_AMBULATORY_CARE_PROVIDER_SITE_OTHER): Payer: PPO | Admitting: Nurse Practitioner

## 2014-09-01 ENCOUNTER — Encounter: Payer: Self-pay | Admitting: Nurse Practitioner

## 2014-09-01 VITALS — BP 124/84 | HR 64 | Temp 97.5°F | Ht 64.0 in | Wt 199.0 lb

## 2014-09-01 DIAGNOSIS — I639 Cerebral infarction, unspecified: Secondary | ICD-10-CM

## 2014-09-01 DIAGNOSIS — I10 Essential (primary) hypertension: Secondary | ICD-10-CM

## 2014-09-01 DIAGNOSIS — I6350 Cerebral infarction due to unspecified occlusion or stenosis of unspecified cerebral artery: Secondary | ICD-10-CM

## 2014-09-01 NOTE — Progress Notes (Signed)
[facility]  PATIENT: Meredith Huff DOB: 1941-03-15   REASON FOR VISIT: Follow-up for history of stroke HISTORY FROM: Patient    HISTORY OF PRESENT ILLNESS:Meredith Huff, 74 year old female returns for followup. She was last in our office 02/17/14. She had a hospital admission for stroke 03/12/2012. She is doing extremely well and feels she has rare tingling numbness or weakness in her hand and has made a full recovery. Blood pressure is doing much better. Today's reading 124/84. Her lipid profile is checked by Dr. Nehemiah Settle and she is on Crestor. Carotid Doppler after last visit was negative for significant stenosis. Will repeat in January 2017. She had a recent sleep study which did not show any significant obstructive sleep disordered breathing, she had mild to moderate intermittent snoring and poor sleep efficiency as well as poor sleep consolidation. She has no history of RLS or significant periodic leg movements of sleep. She is now taking melatonin 3 mg at night and sleeping much better. She exercises 5 times a week at [employer]. She has not had further stroke or TIA symptoms She returns for reevaluation.   HISTORY: She had hospital admission for stroke on 03/12/2012. She woke up from sleep with left hand weakness and clumsiness. The left hand felt heavy but she had no trouble with walking. CT of the head was unremarkable. NIH stroke scale on admission was 2. She was not a TPA candidate secondary to duration of symptoms. She had been taking low-dose aspirin prior to admission. MRI scan of the brain confirmed a small right frontal lacunar infarct. MRA of the brain and carotid ultrasound did not reveal any high-grade stenosis. Lipid profile was normal. 2-D echo showed normal ejection fraction without cardiac source of embolism. Hemoglobin A1c was 5.8%. She was changed from aspirin to Plavix for secondary stroke prevention and advised aggressive control of blood pressure. She  continues with diminished fine finger movements in the left hand. She has some mild tingling in the hand as well. She states her balance is slightly off when she is tired or tries to walk quickly. She is tolerating cllopidogrel without any bleeding, bruising or other side effects. Her blood pressure is elevated today at 160/90 in office today  REVIEW OF SYSTEMS: Full 14 system review of systems performed and notable only for those listed, all others are neg:  Constitutional: neg  Cardiovascular: neg Ear/Nose/Throat: neg  Skin: neg Eyes: neg Respiratory: neg Gastroitestinal: neg  Hematology/Lymphatic: neg  Endocrine: neg Musculoskeletal:neg Allergy/Immunology: neg Neurological: neg Psychiatric: neg Sleep : neg   ALLERGIES: No Known Allergies  HOME MEDICATIONS: Outpatient Prescriptions Prior to Visit  Medication Sig Dispense Refill  . amLODipine (NORVASC) 5 MG tablet     . atenolol (TENORMIN) 50 MG tablet Take 100 mg by mouth daily.     . calcium-vitamin D (OSCAL WITH D) 500-200 MG-UNIT per tablet Take 1 tablet by mouth daily.    . clopidogrel (PLAVIX) 75 MG tablet     . Melatonin 3 MG TABS Take 3 mg by mouth as needed (every other night).    . Multiple Vitamin (MULITIVITAMIN WITH MINERALS) TABS Take 1 tablet by mouth daily.    . rosuvastatin (CRESTOR) 10 MG tablet Take 5 mg by mouth daily.    . traMADol (ULTRAM) 50 MG tablet     . valsartan-hydrochlorothiazide (DIOVAN-HCT) 320-25 MG per tablet Take 1 tablet by mouth daily.     No facility-administered medications prior to visit.    PAST MEDICAL HISTORY:  Past Medical History  Diagnosis Date  . Hypertension   . Hypercholesteremia   . Stroke 03/12/12    "can't control left arm"    PAST SURGICAL HISTORY: Past Surgical History  Procedure Laterality Date  . Vaginal hysterectomy  1980's  . Breast lumpectomy  1970's    right    FAMILY HISTORY: Family History  Problem Relation Age of Onset  . Heart attack Father   .  Dementia Mother     Stroke, heart disease    SOCIAL HISTORY: History   Social History  . Marital Status: Married    Spouse Name: Dimas AguasHoward    Number of Children: 2  . Years of Education: 8th grade   Occupational History  . Retired    Social History Main Topics  . Smoking status: Never Smoker   . Smokeless tobacco: Never Used  . Alcohol Use: No  . Drug Use: No  . Sexual Activity: Not Currently   Other Topics Concern  . Not on file   Social History Narrative   Patient lives at home with husband Dimas Aguas(Howard)   Patient is left handed   Patient educational level is 8 th grade   Caffeine consumption is 10 cups of coffee or more           PHYSICAL EXAM  Filed Vitals:   09/01/14 0924  BP: 124/84  Pulse: 64  Temp: 97.5 F (36.4 C)  TempSrc: Oral  Height: 5\' 4"  (1.626 m)  Weight: 199 lb (90.266 kg)   Body mass index is 34.14 kg/(m^2). Generalized: Well developed, in no acute distress  Head: normocephalic and atraumatic,. Oropharynx benign  Neck: Supple, no carotid bruits  Cardiac: Regular rate rhythm, no murmur  Musculoskeletal: No deformity  Neurological examination  Mentation: Alert oriented to time, place, history taking. Follows all commands speech and language fluent.  Cranial nerve II-XII: Pupils were equal round reactive to light extraocular movements were full, visual field were full on confrontational test. Facial sensation and strength were normal. hearing was intact to finger rubbing bilaterally. Uvula tongue midline. head turning and shoulder shrug were normal and symmetric.Tongue protrusion into cheek strength was normal.  Motor: normal bulk and tone, full strength in the BUE, BLE, fine finger movements normal, no pronator drift. No focal weakness  Sensory: normal and symmetric to light touch, pinprick, and vibration  Coordination: finger-nose-finger, heel-to-shin bilaterally, no dysmetria  Reflexes: Brachioradialis 2/2, biceps 2/2, triceps 2/2,  patellar 2/2, Achilles 2/2, plantar responses were flexor bilaterally.  Gait and Station: Rising up from seated position without assistance, normal stance, moderate stride, good arm swing, smooth turning, able to perform tiptoe, and heel walking without difficulty. Tandem gait is unsteady no assistive device i DIAGNOSTIC DATA (LABS, IMAGING, TESTING) - ASSESSMENT AND PLAN  74 y.o. year old female  has a past medical history of Hypertension; Hypercholesteremia; and Stroke (03/12/12). here to follow-up. She is currently on Plavix without further stroke or TIA symptoms. Her blood pressure and hyperlipidemia is managed by her primary care doctor(Dr.Polite)   Continue Plavix for secondary stroke prevention does not need refills Keep systolic B/P less than 130 (todays reading 124/84) Will repeat carotid Doppler next year Jan 2017 Continue your exercise 5 times weekly Continue blood pressure and lipid follow-up with Dr. Nehemiah SettlePolite  Follow-up yearly and when necessary  Nilda RiggsNancy Carolyn Katelinn Justice, [facility] FranciscoGNP, [facility] IvBC, APRN  [facility] [address], [address] [place], [place] [address] [phone]

## 2014-09-01 NOTE — Patient Instructions (Signed)
Continue Plavix for secondary stroke prevention Keep systolic B/P less than 130 (todays reading 124/84) Will repeat carotid Doppler next year Continue your exercise 5 times weekly Continue blood pressure and lipid follow-up with Dr. Nehemiah SettlePolite  Follow-up yearly and when necessary

## 2014-09-03 NOTE — Progress Notes (Signed)
I agree with the above plan 

## 2015-08-26 DIAGNOSIS — W57XXXA Bitten or stung by nonvenomous insect and other nonvenomous arthropods, initial encounter: Secondary | ICD-10-CM | POA: Diagnosis not present

## 2015-08-26 DIAGNOSIS — B029 Zoster without complications: Secondary | ICD-10-CM | POA: Diagnosis not present

## 2015-08-26 DIAGNOSIS — L298 Other pruritus: Secondary | ICD-10-CM | POA: Diagnosis not present

## 2015-09-01 ENCOUNTER — Telehealth: Payer: Self-pay

## 2015-09-01 NOTE — Telephone Encounter (Signed)
LM for patient to call back. I wanted to talk about her appt on Monday. She does not need to f/u with Dr. Frances Furbish, sleep study showed no OSA. She is due for yearly f/u with Dr. Pearlean Brownie. Patient may have been put on wrong schedule.

## 2015-09-01 NOTE — Telephone Encounter (Signed)
Pt returned Diana's call. Pt's appt with Dr Frances Furbish c/a. Yearly appt made with Dr Pearlean Brownie.  Thank you!

## 2015-09-05 ENCOUNTER — Ambulatory Visit: Payer: Self-pay | Admitting: Neurology

## 2015-09-06 DIAGNOSIS — N183 Chronic kidney disease, stage 3 (moderate): Secondary | ICD-10-CM | POA: Diagnosis not present

## 2015-09-06 DIAGNOSIS — Z1389 Encounter for screening for other disorder: Secondary | ICD-10-CM | POA: Diagnosis not present

## 2015-09-06 DIAGNOSIS — Z6833 Body mass index (BMI) 33.0-33.9, adult: Secondary | ICD-10-CM | POA: Diagnosis not present

## 2015-09-06 DIAGNOSIS — M899 Disorder of bone, unspecified: Secondary | ICD-10-CM | POA: Diagnosis not present

## 2015-09-06 DIAGNOSIS — I10 Essential (primary) hypertension: Secondary | ICD-10-CM | POA: Diagnosis not present

## 2015-09-06 DIAGNOSIS — I679 Cerebrovascular disease, unspecified: Secondary | ICD-10-CM | POA: Diagnosis not present

## 2015-09-06 DIAGNOSIS — Z0000 Encounter for general adult medical examination without abnormal findings: Secondary | ICD-10-CM | POA: Diagnosis not present

## 2015-09-06 DIAGNOSIS — E784 Other hyperlipidemia: Secondary | ICD-10-CM | POA: Diagnosis not present

## 2015-09-06 DIAGNOSIS — E663 Overweight: Secondary | ICD-10-CM | POA: Diagnosis not present

## 2015-09-28 ENCOUNTER — Ambulatory Visit (INDEPENDENT_AMBULATORY_CARE_PROVIDER_SITE_OTHER): Payer: PPO | Admitting: Neurology

## 2015-09-28 ENCOUNTER — Encounter: Payer: Self-pay | Admitting: Neurology

## 2015-09-28 VITALS — BP 137/70 | HR 55 | Ht 64.0 in | Wt 195.6 lb

## 2015-09-28 DIAGNOSIS — I6990 Unspecified sequelae of unspecified cerebrovascular disease: Secondary | ICD-10-CM

## 2015-09-28 NOTE — Progress Notes (Signed)
[facility]  PATIENT: Meredith Huff DOB: Aug 29, 1940   REASON FOR VISIT: Follow-up for history of stroke HISTORY FROM: Patient    HISTORY OF PRESENT ILLNESS:Meredith Huff, 75 year old female returns for followup. She was last in our office 02/17/14. She had a hospital admission for stroke 03/12/2012. She is doing extremely well and feels she has rare tingling numbness or weakness in her hand and has made a full recovery. Blood pressure is doing much better. Today's reading 124/84. Her lipid profile is checked by Dr. Nehemiah Settle and she is on Crestor. Carotid Doppler after last visit was negative for significant stenosis. Will repeat in January 2017. She had a recent sleep study which did not show any significant obstructive sleep disordered breathing, she had mild to moderate intermittent snoring and poor sleep efficiency as well as poor sleep consolidation. She has no history of RLS or significant periodic leg movements of sleep. She is now taking melatonin 3 mg at night and sleeping much better. She exercises 5 times a week at [employer]. She has not had further stroke or TIA symptoms She returns for reevaluation.   HISTORY: She had hospital admission for stroke on 03/12/2012. She woke up from sleep with left hand weakness and clumsiness. The left hand felt heavy but she had no trouble with walking. CT of the head was unremarkable. NIH stroke scale on admission was 2. She was not a TPA candidate secondary to duration of symptoms. She had been taking low-dose aspirin prior to admission. MRI scan of the brain confirmed a small right frontal lacunar infarct. MRA of the brain and carotid ultrasound did not reveal any high-grade stenosis. Lipid profile was normal. 2-D echo showed normal ejection fraction without cardiac source of embolism. Hemoglobin A1c was 5.8%. She was changed from aspirin to Plavix for secondary stroke prevention and advised aggressive control of blood pressure. She  continues with diminished fine finger movements in the left hand. She has some mild tingling in the hand as well. She states her balance is slightly off when she is tired or tries to walk quickly. She is tolerating cllopidogrel without any bleeding, bruising or other side effects. Her blood pressure is elevated today at 160/90 in office today  Update 09/28/2015 :  She returns for follow-up after last visit 1 year ago. She continues to do well without recurrent stroke or TIA symptoms now for nearly 4 years. She remains on Plavix which is tolerating well without bleeding or bruising. States her blood pressure is well controlled and today it is 137/78. She had lipid profile checked a month ago by primary physician and it was fine. She remains on Crestor 10 mg daily which is tolerating well without myalgias or arthralgias. She continues to see Dr. Frances Furbish for sleep problems. She has no new complaints today. She cannot remember when last and carotid Dopplers were checked. REVIEW OF SYSTEMS: Full 14 system review of systems performed and notable only for those listed, all others are neg: No complaints today     ALLERGIES: No Known Allergies  HOME MEDICATIONS: Outpatient Prescriptions Prior to Visit  Medication Sig Dispense Refill  . amLODipine (NORVASC) 5 MG tablet     . atenolol (TENORMIN) 50 MG tablet Take 100 mg by mouth daily.     . calcium-vitamin D (OSCAL WITH D) 500-200 MG-UNIT per tablet Take 1 tablet by mouth daily.    . clopidogrel (PLAVIX) 75 MG tablet     . Melatonin 3 MG TABS Take 3  mg by mouth as needed (every other night).    . Multiple Vitamin (MULITIVITAMIN WITH MINERALS) TABS Take 1 tablet by mouth daily.    . rosuvastatin (CRESTOR) 10 MG tablet Take 5 mg by mouth daily.    . traMADol (ULTRAM) 50 MG tablet     . valsartan-hydrochlorothiazide (DIOVAN-HCT) 320-25 MG per tablet Take 1 tablet by mouth daily.     No facility-administered medications prior to visit.    PAST MEDICAL  HISTORY: Past Medical History  Diagnosis Date  . Hypertension   . Hypercholesteremia   . Stroke [facility]) 03/12/12    "can't control left arm"    PAST SURGICAL HISTORY: Past Surgical History  Procedure Laterality Date  . Vaginal hysterectomy  1980's  . Breast lumpectomy  1970's    right    FAMILY HISTORY: Family History  Problem Relation Age of Onset  . Heart attack Father   . Dementia Mother     Stroke, heart disease    SOCIAL HISTORY: Social History   Social History  . Marital Status: Married    Spouse Name: Dimas Aguas  . Number of Children: 2  . Years of Education: 8th grade   Occupational History  . Retired    Social History Main Topics  . Smoking status: Never Smoker   . Smokeless tobacco: Never Used  . Alcohol Use: No  . Drug Use: No  . Sexual Activity: Not Currently   Other Topics Concern  . Not on file   Social History Narrative   Patient lives at home with husband Dimas Aguas)   Patient is left handed   Patient educational level is 8 th grade   Caffeine consumption is 10 cups of coffee or more           PHYSICAL EXAM  Filed Vitals:   09/28/15 1131  BP: 137/70  Pulse: 55  Height:  (1.626 m)  Weight: 195 lb 9.6 oz (88.724 kg)   Body mass index is 33.56 kg/(m^2). Generalized: Well developed, in no acute distress  Head: normocephalic and atraumatic,. Oropharynx benign  Neck: Supple, no carotid bruits  Cardiac: Regular rate rhythm, no murmur  Musculoskeletal: No deformity  Neurological examination  Mentation: Alert oriented to time, place, history taking. Follows all commands speech and language fluent.  Cranial nerve II-XII: Pupils were equal round reactive to light extraocular movements were full, visual field were full on confrontational test. Facial sensation and strength were normal. hearing was intact to finger rubbing bilaterally. Uvula tongue midline. head turning and shoulder shrug were normal and symmetric.Tongue protrusion into  cheek strength was normal.  Motor: normal bulk and tone, full strength in the BUE, BLE, fine finger movements normal, no pronator drift. No focal weakness  Sensory: normal and symmetric to light touch, pinprick, and vibration  Coordination: finger-nose-finger, heel-to-shin bilaterally, no dysmetria  Reflexes: Brachioradialis 2/2, biceps 2/2, triceps 2/2, patellar 2/2, Achilles 2/2, plantar responses were flexor bilaterally.  Gait and Station: Rising up from seated position without assistance, normal stance, moderate stride, good arm swing, smooth turning, able to perform tiptoe, and heel walking without difficulty. Tandem gait is unsteady  i DIAGNOSTIC DATA (LABS, IMAGING, TESTING) - ASSESSMENT AND PLAN  75 y.o. year old female  has a past medical history of Hypertension; Hypercholesteremia; and Stroke (HCC) (03/12/12). here to follow-up. She has done well without neurovascular symptoms for nearly 4 years now. I had a long d/w patient about her remote stroke, risk for recurrent stroke/TIAs, personally independently reviewed imaging studies  and stroke evaluation results and answered questions.ContinuePlavix for secondary stroke prevention and maintain strict control of hypertension with blood pressure goal below 130/90, diabetes with hemoglobin A1c goal below 6.5% and lipids with LDL cholesterol goal below 70 mg/dL. I also advised the patient to eat a healthy diet with plenty of whole grains, cereals, fruits and vegetables, exercise regularly and maintain ideal body weight . Since patient has been free from neurovascular symptoms for nearly 4 years I do not believe further routine follow-up is necessary in this clinic. She can be referred back in the future as necessary. Delia Heady, MD [facility] [address], [address] [place], [place] [address] [phone]

## 2015-09-28 NOTE — Patient Instructions (Signed)
I had a long d/w patient about her remote stroke, risk for recurrent stroke/TIAs, personally independently reviewed imaging studies and stroke evaluation results and answered questions.ContinuePlavix for secondary stroke prevention and maintain strict control of hypertension with blood pressure goal below 130/90, diabetes with hemoglobin A1c goal below 6.5% and lipids with LDL cholesterol goal below 70 mg/dL. I also advised the patient to eat a healthy diet with plenty of whole grains, cereals, fruits and vegetables, exercise regularly and maintain ideal body weight . Since patient has been free from neurovascular symptoms for nearly 4 years I do not believe further routine follow-up is necessary in this clinic. She can be referred back in the future as necessary.

## 2016-03-12 DIAGNOSIS — E7800 Pure hypercholesterolemia, unspecified: Secondary | ICD-10-CM | POA: Diagnosis not present

## 2016-03-12 DIAGNOSIS — E663 Overweight: Secondary | ICD-10-CM | POA: Diagnosis not present

## 2016-03-12 DIAGNOSIS — N183 Chronic kidney disease, stage 3 (moderate): Secondary | ICD-10-CM | POA: Diagnosis not present

## 2016-03-12 DIAGNOSIS — R739 Hyperglycemia, unspecified: Secondary | ICD-10-CM | POA: Diagnosis not present

## 2016-03-12 DIAGNOSIS — I679 Cerebrovascular disease, unspecified: Secondary | ICD-10-CM | POA: Diagnosis not present

## 2016-03-12 DIAGNOSIS — I10 Essential (primary) hypertension: Secondary | ICD-10-CM | POA: Diagnosis not present

## 2016-03-12 DIAGNOSIS — Z6834 Body mass index (BMI) 34.0-34.9, adult: Secondary | ICD-10-CM | POA: Diagnosis not present

## 2016-03-14 DIAGNOSIS — R7303 Prediabetes: Secondary | ICD-10-CM | POA: Diagnosis not present

## 2016-04-27 DIAGNOSIS — K645 Perianal venous thrombosis: Secondary | ICD-10-CM | POA: Diagnosis not present

## 2016-05-11 DIAGNOSIS — Z1231 Encounter for screening mammogram for malignant neoplasm of breast: Secondary | ICD-10-CM | POA: Diagnosis not present

## 2016-05-11 DIAGNOSIS — Z803 Family history of malignant neoplasm of breast: Secondary | ICD-10-CM | POA: Diagnosis not present

## 2016-05-15 DIAGNOSIS — N6001 Solitary cyst of right breast: Secondary | ICD-10-CM | POA: Diagnosis not present

## 2016-05-15 DIAGNOSIS — Z803 Family history of malignant neoplasm of breast: Secondary | ICD-10-CM | POA: Diagnosis not present

## 2016-07-02 DIAGNOSIS — L03116 Cellulitis of left lower limb: Secondary | ICD-10-CM | POA: Diagnosis not present

## 2016-07-07 ENCOUNTER — Encounter (HOSPITAL_COMMUNITY): Payer: Self-pay

## 2016-07-07 ENCOUNTER — Emergency Department (HOSPITAL_COMMUNITY)
Admission: EM | Admit: 2016-07-07 | Discharge: 2016-07-08 | Disposition: A | Discharge status: Home / Self Care | Payer: PPO | Attending: Emergency Medicine | Admitting: Emergency Medicine

## 2016-07-07 DIAGNOSIS — L03116 Cellulitis of left lower limb: Secondary | ICD-10-CM | POA: Diagnosis not present

## 2016-07-07 DIAGNOSIS — I10 Essential (primary) hypertension: Secondary | ICD-10-CM | POA: Insufficient documentation

## 2016-07-07 DIAGNOSIS — Z8673 Personal history of transient ischemic attack (TIA), and cerebral infarction without residual deficits: Secondary | ICD-10-CM | POA: Diagnosis not present

## 2016-07-07 DIAGNOSIS — M7989 Other specified soft tissue disorders: Secondary | ICD-10-CM | POA: Diagnosis not present

## 2016-07-07 NOTE — ED Provider Notes (Signed)
[facility]-EMERGENCY DEPT Provider Note   CSN: [id number] Arrival date & time: 07/07/16  1826  By signing my name below, I, Clovis Pu, attest that this documentation has been prepared under the direction and in the presence of  Earley Favor, NP. Electronically Signed: Clovis Pu, ED Scribe. 07/07/16. 11:35 PM.   History   Chief Complaint Chief Complaint  Patient presents with  . Leg Pain    Left   . Cellulitis   The history is provided by the patient. No language interpreter was used.   HPI Comments:  Meredith Huff is a 75 y.o. female who presents to the Emergency Department complaining of worsening left leg swelling and associated redness x several days. She notes her swelling began at the lower part of her left calf and notes it has radiated up her to her left knee. Pt was seen by her PCP on 07/02/16 and was prescribed Keflex which she has taken with little relief. Pt denies fevers, a hx of blood clots, any other associated symptoms and modifying factors at this time. Pt has been on Plavix for ~ 4.5 years after a stroke.    Past Medical History:  Diagnosis Date  . Hypercholesteremia   . Hypertension   . Stroke [facility]) 03/12/12   "can't control left arm"    Patient Active Problem List   Diagnosis Date Noted  . Cerebral artery occlusion with cerebral infarction (HCC) 02/17/2013  . Stroke (HCC) 03/12/2012    Class: Acute  . Hypertension 03/12/2012    Class: Chronic  . Dyslipidemia 03/12/2012    Class: Chronic    Past Surgical History:  Procedure Laterality Date  . BREAST LUMPECTOMY  1970's   right  . VAGINAL HYSTERECTOMY  1980's    OB History    No data available       Home Medications    Prior to Admission medications   Medication Sig Start Date End Date Taking? Authorizing Provider  amLODipine (NORVASC) 5 MG tablet  06/22/13  Yes Historical Provider, MD  atenolol (TENORMIN) 50 MG tablet Take 100 mg by mouth daily.    Yes Historical Provider, MD  atorvastatin  (LIPITOR) 20 MG tablet Take 20 mg by mouth daily.   Yes Historical Provider, MD  calcium-vitamin D (OSCAL WITH D) 500-200 MG-UNIT per tablet Take 1 tablet by mouth daily.   Yes Historical Provider, MD  cephALEXin (KEFLEX) 500 MG capsule Take 500 mg by mouth 3 (three) times daily. x10 days 07/02/16  Yes Historical Provider, MD  clopidogrel (PLAVIX) 75 MG tablet  06/22/13  Yes Historical Provider, MD  Melatonin 3 MG TABS Take 3 mg by mouth daily as needed (sleep).    Yes Historical Provider, MD  Multiple Vitamin (MULITIVITAMIN WITH MINERALS) TABS Take 1 tablet by mouth daily.   Yes Historical Provider, MD  valsartan-hydrochlorothiazide (DIOVAN-HCT) 320-25 MG per tablet Take 1 tablet by mouth daily.   Yes Historical Provider, MD  HYDROcodone-acetaminophen (NORCO/VICODIN) 5-325 MG tablet Take 2 tablets by mouth every 4 (four) hours as needed. 07/08/16   Earley Favor, NP  sulfamethoxazole-trimethoprim (BACTRIM DS,SEPTRA DS) 800-160 MG tablet Take 1 tablet by mouth 2 (two) times daily. 07/08/16 07/15/16  Earley Favor, NP    Family History Family History  Problem Relation Age of Onset  . Dementia Mother     Stroke, heart disease  . Heart attack Father     Social History Social History  Substance Use Topics  . Smoking status: Never Smoker  . Smokeless tobacco:  Never Used  . Alcohol use No     Allergies   Patient has no known allergies.   Review of Systems Review of Systems  Constitutional: Negative for fever.  Cardiovascular: Positive for leg swelling.  Skin: Positive for color change (erythema).  All other systems reviewed and are negative.    Physical Exam Updated Vital Signs BP 142/77   Pulse 71   Temp 97.8 F (36.6 C) (Oral)   Resp 18   Ht 5\' 2"  (1.575 m)   Wt 89.4 kg   SpO2 96%   BMI 36.03 kg/m   Physical Exam  Constitutional: She appears well-developed and well-nourished.  Eyes: Pupils are equal, round, and reactive to light.  Neck: Normal range of motion.    Cardiovascular: Normal rate.   Pulmonary/Chest: Effort normal.  Musculoskeletal: She exhibits edema and tenderness.       Legs: Neurological: She is alert.  Skin: There is erythema.  Nursing note and vitals reviewed.    ED Treatments / Results  DIAGNOSTIC STUDIES:  Oxygen Saturation is 98% on RA, normal by my interpretation.    COORDINATION OF CARE:  11:33 PM Discussed treatment plan with pt at bedside and pt agreed to plan.  Labs (all labs ordered are listed, but only abnormal results are displayed) Labs Reviewed  CBC WITH DIFFERENTIAL/PLATELET - Abnormal; Notable for the following:       Result Value   HCT 35.8 (*)    All other components within normal limits  I-STAT CHEM 8, ED - Abnormal; Notable for the following:    Potassium 5.7 (*)    BUN 41 (*)    Creatinine, Ser 1.30 (*)    Glucose, Bld 105 (*)    Calcium, Ion 1.11 (*)    Hemoglobin 11.9 (*)    HCT 35.0 (*)    All other components within normal limits    EKG  EKG Interpretation None       Radiology Ct Knee Left Wo Contrast  Result Date: 07/08/2016 CLINICAL DATA:  Patient complains of swelling and erythema in her left leg behind her knee. Pt placed on antibiotics by her primary care physician on Monday, but has not gotten any better. Also complains of pain, but is ambulatory. Denies drainage or itching. EXAM: CT OF THE left KNEE WITHOUT CONTRAST TECHNIQUE: Multidetector CT imaging of the left knee was performed according to the standard protocol. Multiplanar CT image reconstructions were also generated. COMPARISON:  Left knee radiographs 09/20/2011 FINDINGS: Bones/Joint/Cartilage Mild degenerative changes in the left knee with medial compartment narrowing and sclerosis and with small osteophytes in the medial, lateral, and patellofemoral compartments. No evidence of acute fracture or dislocation. No focal bone lesion or bone destruction. Bone cortex appears intact. No cortical erosion to suggest  osteomyelitis. Ligaments Suboptimally assessed by CT. Muscles and Tendons No significant muscular infiltration or hematoma demonstrated. Soft tissues Focal infiltration and skin thickening along the posterior medial aspect of the left knee likely to represent cellulitis. No loculated fluid collection to suggest an abscess. No soft tissue gas or soft tissue foreign bodies. 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 No significant left knee effusion. IMPRESSION: No acute bony abnormalities. Mild degenerative changes in the left knee. Infiltration and skin thickening along the posterior medial aspect of the left knee likely to represent cellulitis. No discrete abscess. Multiple venous varicosities. Electronically Signed   By: Burman NievesWilliam  Stevens M.D.   On: 07/08/2016 00:52    Procedures Procedures (including critical care time)  Medications Ordered in  ED Medications  HYDROcodone-acetaminophen (NORCO/VICODIN) 5-325 MG per tablet 1 tablet (1 tablet Oral Given 07/08/16 0128)  sulfamethoxazole-trimethoprim (BACTRIM DS,SEPTRA DS) 800-160 MG per tablet 1 tablet (1 tablet Oral Given 07/08/16 0128)     Initial Impression / Assessment and Plan / ED Course  I have reviewed the triage vital signs and the nursing notes.  Pertinent labs & imaging results that were available during my care of the patient were reviewed by me and considered in my medical decision making (see chart for details).  Clinical Course      Dr. Adela LankFloyd examined patient with ultrasound No definitive abscess identified with obtain CT Scan   Final Clinical Impressions(s) / ED Diagnoses   Final diagnoses:  Left leg cellulitis    New Prescriptions New Prescriptions   HYDROCODONE-ACETAMINOPHEN (NORCO/VICODIN) 5-325 MG TABLET    Take 2 tablets by mouth every 4 (four) hours as needed.   SULFAMETHOXAZOLE-TRIMETHOPRIM (BACTRIM DS,SEPTRA DS) 800-160 MG TABLET    Take 1 tablet by mouth 2 (two) times daily.  I personally performed the  services described in this documentation, which was scribed in my presence. The recorded information has been reviewed and is accurate.    Earley FavorGail Tate Jerkins, NP 07/08/16 0030    Earley FavorGail Jakhiya Brower, NP 07/08/16 0132    Melene Planan Floyd, DO 07/08/16 [id number]

## 2016-07-07 NOTE — ED Triage Notes (Addendum)
Pt c/o swelling and erythema in her L leg behind her knee. Pt placed on antibiotics by her PCP on Monday, but has not gotten any better. Also c/o pain, but is ambulatory. Denies drainage or itching. Denies SOB or recent extended periods of sitting. Endorses pain, swelling and soreness.

## 2016-07-08 ENCOUNTER — Emergency Department (HOSPITAL_COMMUNITY): Payer: PPO

## 2016-07-08 DIAGNOSIS — M7989 Other specified soft tissue disorders: Secondary | ICD-10-CM | POA: Diagnosis not present

## 2016-07-08 LAB — I-STAT CHEM 8, ED
BUN: 41 mg/dL — ABNORMAL HIGH (ref 6–20)
CREATININE: 1.3 mg/dL — AB (ref 0.44–1.00)
Calcium, Ion: 1.11 mmol/L — ABNORMAL LOW (ref 1.15–1.40)
Chloride: 101 mmol/L (ref 101–111)
Glucose, Bld: 105 mg/dL — ABNORMAL HIGH (ref 65–99)
HEMATOCRIT: 35 % — AB (ref 36.0–46.0)
HEMOGLOBIN: 11.9 g/dL — AB (ref 12.0–15.0)
POTASSIUM: 5.7 mmol/L — AB (ref 3.5–5.1)
Sodium: 136 mmol/L (ref 135–145)
TCO2: 30 mmol/L (ref 0–100)

## 2016-07-08 LAB — CBC WITH DIFFERENTIAL/PLATELET
BASOS ABS: 0 10*3/uL (ref 0.0–0.1)
BASOS PCT: 0 %
Eosinophils Absolute: 0.1 10*3/uL (ref 0.0–0.7)
Eosinophils Relative: 1 %
HEMATOCRIT: 35.8 % — AB (ref 36.0–46.0)
Hemoglobin: 12.3 g/dL (ref 12.0–15.0)
Lymphocytes Relative: 27 %
Lymphs Abs: 2.2 10*3/uL (ref 0.7–4.0)
MCH: 31.5 pg (ref 26.0–34.0)
MCHC: 34.4 g/dL (ref 30.0–36.0)
MCV: 91.8 fL (ref 78.0–100.0)
MONO ABS: 0.8 10*3/uL (ref 0.1–1.0)
Monocytes Relative: 9 %
NEUTROS ABS: 5.2 10*3/uL (ref 1.7–7.7)
NEUTROS PCT: 63 %
Platelets: 247 10*3/uL (ref 150–400)
RBC: 3.9 MIL/uL (ref 3.87–5.11)
RDW: 13.4 % (ref 11.5–15.5)
WBC: 8.3 10*3/uL (ref 4.0–10.5)

## 2016-07-08 MED ORDER — SULFAMETHOXAZOLE-TRIMETHOPRIM 800-160 MG PO TABS
1.0000 | ORAL_TABLET | Freq: Once | ORAL | Status: AC
  Administered 2016-07-08: 1 via ORAL
  Filled 2016-07-08: qty 1

## 2016-07-08 MED ORDER — HYDROCODONE-ACETAMINOPHEN 5-325 MG PO TABS: 2.0000 | ORAL_TABLET | ORAL | 0 refills | Status: DC | PRN

## 2016-07-08 MED ORDER — SULFAMETHOXAZOLE-TRIMETHOPRIM 800-160 MG PO TABS: 1.0000 | ORAL_TABLET | Freq: Two times a day (BID) | ORAL | 0 refills | Status: AC

## 2016-07-08 MED ORDER — HYDROCODONE-ACETAMINOPHEN 5-325 MG PO TABS
1.0000 | ORAL_TABLET | Freq: Once | ORAL | Status: AC
  Administered 2016-07-08: 1 via ORAL
  Filled 2016-07-08: qty 1

## 2016-07-08 NOTE — Discharge Instructions (Signed)
You received a CT scan of your leg today which shows no abscess, but a cellulitis at added an additional antibiotic called Septra or Bactrim.  Please take this as directed plus Keflex that you're currently taking been given a prescription for Vicodin for pain control as well.  The area has been outlined with a skin marker.  If the erythema/redness extends past this marking.  Please return for further evaluation.  Otherwise, would like to see you back in 2 days for reexamination.  This can be at your physician's office or in the ED, if needed

## 2016-07-10 DIAGNOSIS — L03116 Cellulitis of left lower limb: Secondary | ICD-10-CM | POA: Diagnosis not present

## 2016-07-17 DIAGNOSIS — L03116 Cellulitis of left lower limb: Secondary | ICD-10-CM | POA: Diagnosis not present

## 2016-09-19 DIAGNOSIS — I10 Essential (primary) hypertension: Secondary | ICD-10-CM | POA: Diagnosis not present

## 2016-09-19 DIAGNOSIS — N183 Chronic kidney disease, stage 3 (moderate): Secondary | ICD-10-CM | POA: Diagnosis not present

## 2016-09-19 DIAGNOSIS — E7800 Pure hypercholesterolemia, unspecified: Secondary | ICD-10-CM | POA: Diagnosis not present

## 2016-09-19 DIAGNOSIS — M899 Disorder of bone, unspecified: Secondary | ICD-10-CM | POA: Diagnosis not present

## 2016-09-19 DIAGNOSIS — Z1389 Encounter for screening for other disorder: Secondary | ICD-10-CM | POA: Diagnosis not present

## 2016-09-19 DIAGNOSIS — Z6834 Body mass index (BMI) 34.0-34.9, adult: Secondary | ICD-10-CM | POA: Diagnosis not present

## 2016-09-19 DIAGNOSIS — E663 Overweight: Secondary | ICD-10-CM | POA: Diagnosis not present

## 2016-09-19 DIAGNOSIS — Z0000 Encounter for general adult medical examination without abnormal findings: Secondary | ICD-10-CM | POA: Diagnosis not present

## 2016-09-19 DIAGNOSIS — I679 Cerebrovascular disease, unspecified: Secondary | ICD-10-CM | POA: Diagnosis not present

## 2017-03-20 DIAGNOSIS — I679 Cerebrovascular disease, unspecified: Secondary | ICD-10-CM | POA: Diagnosis not present

## 2017-03-20 DIAGNOSIS — E7800 Pure hypercholesterolemia, unspecified: Secondary | ICD-10-CM | POA: Diagnosis not present

## 2017-03-20 DIAGNOSIS — N183 Chronic kidney disease, stage 3 (moderate): Secondary | ICD-10-CM | POA: Diagnosis not present

## 2017-03-20 DIAGNOSIS — I10 Essential (primary) hypertension: Secondary | ICD-10-CM | POA: Diagnosis not present

## 2017-05-13 DIAGNOSIS — Z803 Family history of malignant neoplasm of breast: Secondary | ICD-10-CM | POA: Diagnosis not present

## 2017-05-13 DIAGNOSIS — Z1231 Encounter for screening mammogram for malignant neoplasm of breast: Secondary | ICD-10-CM | POA: Diagnosis not present

## 2017-10-04 DIAGNOSIS — I679 Cerebrovascular disease, unspecified: Secondary | ICD-10-CM | POA: Diagnosis not present

## 2017-10-04 DIAGNOSIS — E663 Overweight: Secondary | ICD-10-CM | POA: Diagnosis not present

## 2017-10-04 DIAGNOSIS — R7301 Impaired fasting glucose: Secondary | ICD-10-CM | POA: Diagnosis not present

## 2017-10-04 DIAGNOSIS — I10 Essential (primary) hypertension: Secondary | ICD-10-CM | POA: Diagnosis not present

## 2017-10-04 DIAGNOSIS — Z0000 Encounter for general adult medical examination without abnormal findings: Secondary | ICD-10-CM | POA: Diagnosis not present

## 2017-10-04 DIAGNOSIS — E7800 Pure hypercholesterolemia, unspecified: Secondary | ICD-10-CM | POA: Diagnosis not present

## 2017-10-04 DIAGNOSIS — N183 Chronic kidney disease, stage 3 (moderate): Secondary | ICD-10-CM | POA: Diagnosis not present

## 2017-10-04 DIAGNOSIS — Z1389 Encounter for screening for other disorder: Secondary | ICD-10-CM | POA: Diagnosis not present

## 2017-10-04 DIAGNOSIS — M899 Disorder of bone, unspecified: Secondary | ICD-10-CM | POA: Diagnosis not present

## 2017-10-10 DIAGNOSIS — E7849 Other hyperlipidemia: Secondary | ICD-10-CM | POA: Diagnosis not present

## 2017-10-10 DIAGNOSIS — N183 Chronic kidney disease, stage 3 (moderate): Secondary | ICD-10-CM | POA: Diagnosis not present

## 2017-10-10 DIAGNOSIS — I10 Essential (primary) hypertension: Secondary | ICD-10-CM | POA: Diagnosis not present

## 2017-12-17 DIAGNOSIS — I809 Phlebitis and thrombophlebitis of unspecified site: Secondary | ICD-10-CM | POA: Diagnosis not present

## 2017-12-17 DIAGNOSIS — R32 Unspecified urinary incontinence: Secondary | ICD-10-CM | POA: Diagnosis not present

## 2018-01-13 DIAGNOSIS — N3946 Mixed incontinence: Secondary | ICD-10-CM | POA: Diagnosis not present

## 2018-01-13 DIAGNOSIS — N8110 Cystocele, unspecified: Secondary | ICD-10-CM | POA: Diagnosis not present

## 2018-01-13 DIAGNOSIS — N200 Calculus of kidney: Secondary | ICD-10-CM | POA: Diagnosis not present

## 2018-02-24 DIAGNOSIS — N3946 Mixed incontinence: Secondary | ICD-10-CM | POA: Diagnosis not present

## 2018-02-24 DIAGNOSIS — N200 Calculus of kidney: Secondary | ICD-10-CM | POA: Diagnosis not present

## 2018-02-24 DIAGNOSIS — N8110 Cystocele, unspecified: Secondary | ICD-10-CM | POA: Diagnosis not present

## 2018-03-11 DIAGNOSIS — E785 Hyperlipidemia, unspecified: Secondary | ICD-10-CM | POA: Diagnosis not present

## 2018-03-11 DIAGNOSIS — I10 Essential (primary) hypertension: Secondary | ICD-10-CM | POA: Diagnosis not present

## 2018-03-11 DIAGNOSIS — N183 Chronic kidney disease, stage 3 (moderate): Secondary | ICD-10-CM | POA: Diagnosis not present

## 2018-03-19 DIAGNOSIS — R42 Dizziness and giddiness: Secondary | ICD-10-CM | POA: Diagnosis not present

## 2018-03-19 DIAGNOSIS — I951 Orthostatic hypotension: Secondary | ICD-10-CM | POA: Diagnosis not present

## 2018-03-21 DIAGNOSIS — R42 Dizziness and giddiness: Secondary | ICD-10-CM | POA: Diagnosis not present

## 2018-03-21 DIAGNOSIS — N182 Chronic kidney disease, stage 2 (mild): Secondary | ICD-10-CM | POA: Diagnosis not present

## 2018-04-03 DIAGNOSIS — R7301 Impaired fasting glucose: Secondary | ICD-10-CM | POA: Diagnosis not present

## 2018-04-03 DIAGNOSIS — I679 Cerebrovascular disease, unspecified: Secondary | ICD-10-CM | POA: Diagnosis not present

## 2018-04-03 DIAGNOSIS — E7800 Pure hypercholesterolemia, unspecified: Secondary | ICD-10-CM | POA: Diagnosis not present

## 2018-04-03 DIAGNOSIS — R32 Unspecified urinary incontinence: Secondary | ICD-10-CM | POA: Diagnosis not present

## 2018-04-03 DIAGNOSIS — N183 Chronic kidney disease, stage 3 (moderate): Secondary | ICD-10-CM | POA: Diagnosis not present

## 2018-04-03 DIAGNOSIS — I10 Essential (primary) hypertension: Secondary | ICD-10-CM | POA: Diagnosis not present

## 2018-05-14 DIAGNOSIS — Z1231 Encounter for screening mammogram for malignant neoplasm of breast: Secondary | ICD-10-CM | POA: Diagnosis not present

## 2018-07-23 DIAGNOSIS — N182 Chronic kidney disease, stage 2 (mild): Secondary | ICD-10-CM | POA: Diagnosis not present

## 2018-07-23 DIAGNOSIS — I10 Essential (primary) hypertension: Secondary | ICD-10-CM | POA: Diagnosis not present

## 2018-07-23 DIAGNOSIS — N183 Chronic kidney disease, stage 3 (moderate): Secondary | ICD-10-CM | POA: Diagnosis not present

## 2018-07-23 DIAGNOSIS — E7841 Elevated Lipoprotein(a): Secondary | ICD-10-CM | POA: Diagnosis not present

## 2018-09-30 DIAGNOSIS — M25562 Pain in left knee: Secondary | ICD-10-CM | POA: Diagnosis not present

## 2018-10-01 DIAGNOSIS — M1712 Unilateral primary osteoarthritis, left knee: Secondary | ICD-10-CM | POA: Diagnosis not present

## 2018-10-27 DIAGNOSIS — Z1389 Encounter for screening for other disorder: Secondary | ICD-10-CM | POA: Diagnosis not present

## 2018-10-27 DIAGNOSIS — E0590 Thyrotoxicosis, unspecified without thyrotoxic crisis or storm: Secondary | ICD-10-CM | POA: Diagnosis not present

## 2018-10-27 DIAGNOSIS — I10 Essential (primary) hypertension: Secondary | ICD-10-CM | POA: Diagnosis not present

## 2018-10-27 DIAGNOSIS — M899 Disorder of bone, unspecified: Secondary | ICD-10-CM | POA: Diagnosis not present

## 2018-10-27 DIAGNOSIS — E663 Overweight: Secondary | ICD-10-CM | POA: Diagnosis not present

## 2018-10-27 DIAGNOSIS — N182 Chronic kidney disease, stage 2 (mild): Secondary | ICD-10-CM | POA: Diagnosis not present

## 2018-10-27 DIAGNOSIS — R7309 Other abnormal glucose: Secondary | ICD-10-CM | POA: Diagnosis not present

## 2018-10-27 DIAGNOSIS — I679 Cerebrovascular disease, unspecified: Secondary | ICD-10-CM | POA: Diagnosis not present

## 2018-10-27 DIAGNOSIS — R7301 Impaired fasting glucose: Secondary | ICD-10-CM | POA: Diagnosis not present

## 2018-10-27 DIAGNOSIS — E7800 Pure hypercholesterolemia, unspecified: Secondary | ICD-10-CM | POA: Diagnosis not present

## 2018-10-27 DIAGNOSIS — Z0000 Encounter for general adult medical examination without abnormal findings: Secondary | ICD-10-CM | POA: Diagnosis not present

## 2018-10-29 DIAGNOSIS — M1712 Unilateral primary osteoarthritis, left knee: Secondary | ICD-10-CM | POA: Diagnosis not present

## 2018-10-30 ENCOUNTER — Other Ambulatory Visit: Payer: Self-pay | Admitting: Internal Medicine

## 2018-10-31 ENCOUNTER — Other Ambulatory Visit: Payer: Self-pay | Admitting: Internal Medicine

## 2018-10-31 ENCOUNTER — Other Ambulatory Visit (HOSPITAL_COMMUNITY): Payer: Self-pay | Admitting: Internal Medicine

## 2018-10-31 DIAGNOSIS — I10 Essential (primary) hypertension: Secondary | ICD-10-CM | POA: Diagnosis not present

## 2018-10-31 DIAGNOSIS — N183 Chronic kidney disease, stage 3 (moderate): Secondary | ICD-10-CM | POA: Diagnosis not present

## 2018-10-31 DIAGNOSIS — E7849 Other hyperlipidemia: Secondary | ICD-10-CM | POA: Diagnosis not present

## 2018-10-31 DIAGNOSIS — N182 Chronic kidney disease, stage 2 (mild): Secondary | ICD-10-CM | POA: Diagnosis not present

## 2018-11-19 ENCOUNTER — Other Ambulatory Visit: Payer: Self-pay | Admitting: Internal Medicine

## 2018-11-19 DIAGNOSIS — E0590 Thyrotoxicosis, unspecified without thyrotoxic crisis or storm: Secondary | ICD-10-CM

## 2018-11-25 ENCOUNTER — Other Ambulatory Visit: Payer: Self-pay | Admitting: Internal Medicine

## 2018-11-25 DIAGNOSIS — M8580 Other specified disorders of bone density and structure, unspecified site: Secondary | ICD-10-CM

## 2018-12-10 DIAGNOSIS — N183 Chronic kidney disease, stage 3 (moderate): Secondary | ICD-10-CM | POA: Diagnosis not present

## 2018-12-10 DIAGNOSIS — I10 Essential (primary) hypertension: Secondary | ICD-10-CM | POA: Diagnosis not present

## 2018-12-10 DIAGNOSIS — E7800 Pure hypercholesterolemia, unspecified: Secondary | ICD-10-CM | POA: Diagnosis not present

## 2018-12-31 DIAGNOSIS — M1712 Unilateral primary osteoarthritis, left knee: Secondary | ICD-10-CM | POA: Diagnosis not present

## 2019-01-15 ENCOUNTER — Encounter (HOSPITAL_COMMUNITY)
Admission: RE | Admit: 2019-01-15 | Discharge: 2019-01-15 | Disposition: A | Discharge status: Home / Self Care | Payer: PPO | Source: Ambulatory Visit | Attending: Internal Medicine | Admitting: Internal Medicine

## 2019-01-15 ENCOUNTER — Other Ambulatory Visit: Payer: Self-pay

## 2019-01-15 DIAGNOSIS — E0590 Thyrotoxicosis, unspecified without thyrotoxic crisis or storm: Secondary | ICD-10-CM

## 2019-01-15 MED ORDER — SODIUM IODIDE I-123 7.4 MBQ CAPS: 417.0000 | ORAL_CAPSULE | Freq: Once | ORAL | Status: DC

## 2019-01-16 ENCOUNTER — Encounter (HOSPITAL_COMMUNITY)
Admission: RE | Admit: 2019-01-16 | Discharge: 2019-01-16 | Disposition: A | Discharge status: Home / Self Care | Payer: PPO | Source: Ambulatory Visit | Attending: Internal Medicine | Admitting: Internal Medicine

## 2019-01-16 DIAGNOSIS — E0590 Thyrotoxicosis, unspecified without thyrotoxic crisis or storm: Secondary | ICD-10-CM | POA: Diagnosis not present

## 2019-01-27 DIAGNOSIS — R946 Abnormal results of thyroid function studies: Secondary | ICD-10-CM | POA: Diagnosis not present

## 2019-03-03 ENCOUNTER — Ambulatory Visit
Admission: RE | Admit: 2019-03-03 | Discharge: 2019-03-03 | Disposition: A | Discharge status: Home / Self Care | Payer: PPO | Source: Ambulatory Visit | Attending: Internal Medicine | Admitting: Internal Medicine

## 2019-03-03 ENCOUNTER — Other Ambulatory Visit: Payer: Self-pay

## 2019-03-03 DIAGNOSIS — Z780 Asymptomatic menopausal state: Secondary | ICD-10-CM | POA: Diagnosis not present

## 2019-03-03 DIAGNOSIS — M85851 Other specified disorders of bone density and structure, right thigh: Secondary | ICD-10-CM | POA: Diagnosis not present

## 2019-03-03 DIAGNOSIS — M8580 Other specified disorders of bone density and structure, unspecified site: Secondary | ICD-10-CM

## 2019-03-10 DIAGNOSIS — N182 Chronic kidney disease, stage 2 (mild): Secondary | ICD-10-CM | POA: Diagnosis not present

## 2019-03-10 DIAGNOSIS — I10 Essential (primary) hypertension: Secondary | ICD-10-CM | POA: Diagnosis not present

## 2019-03-10 DIAGNOSIS — E7800 Pure hypercholesterolemia, unspecified: Secondary | ICD-10-CM | POA: Diagnosis not present

## 2019-03-10 DIAGNOSIS — E7849 Other hyperlipidemia: Secondary | ICD-10-CM | POA: Diagnosis not present

## 2019-03-10 DIAGNOSIS — N183 Chronic kidney disease, stage 3 (moderate): Secondary | ICD-10-CM | POA: Diagnosis not present

## 2019-04-14 DIAGNOSIS — E7800 Pure hypercholesterolemia, unspecified: Secondary | ICD-10-CM | POA: Diagnosis not present

## 2019-04-14 DIAGNOSIS — N183 Chronic kidney disease, stage 3 (moderate): Secondary | ICD-10-CM | POA: Diagnosis not present

## 2019-04-14 DIAGNOSIS — I10 Essential (primary) hypertension: Secondary | ICD-10-CM | POA: Diagnosis not present

## 2019-04-14 DIAGNOSIS — E7849 Other hyperlipidemia: Secondary | ICD-10-CM | POA: Diagnosis not present

## 2019-04-14 DIAGNOSIS — N182 Chronic kidney disease, stage 2 (mild): Secondary | ICD-10-CM | POA: Diagnosis not present

## 2019-04-22 DIAGNOSIS — M1712 Unilateral primary osteoarthritis, left knee: Secondary | ICD-10-CM | POA: Diagnosis not present

## 2019-05-04 DIAGNOSIS — I10 Essential (primary) hypertension: Secondary | ICD-10-CM | POA: Diagnosis not present

## 2019-05-04 DIAGNOSIS — E7800 Pure hypercholesterolemia, unspecified: Secondary | ICD-10-CM | POA: Diagnosis not present

## 2019-05-04 DIAGNOSIS — E663 Overweight: Secondary | ICD-10-CM | POA: Diagnosis not present

## 2019-05-04 DIAGNOSIS — R7309 Other abnormal glucose: Secondary | ICD-10-CM | POA: Diagnosis not present

## 2019-05-04 DIAGNOSIS — I679 Cerebrovascular disease, unspecified: Secondary | ICD-10-CM | POA: Diagnosis not present

## 2019-05-04 DIAGNOSIS — F439 Reaction to severe stress, unspecified: Secondary | ICD-10-CM | POA: Diagnosis not present

## 2019-05-04 DIAGNOSIS — N183 Chronic kidney disease, stage 3 (moderate): Secondary | ICD-10-CM | POA: Diagnosis not present

## 2019-05-18 DIAGNOSIS — Z1231 Encounter for screening mammogram for malignant neoplasm of breast: Secondary | ICD-10-CM | POA: Diagnosis not present

## 2019-05-18 DIAGNOSIS — Z803 Family history of malignant neoplasm of breast: Secondary | ICD-10-CM | POA: Diagnosis not present

## 2019-07-22 DIAGNOSIS — M1712 Unilateral primary osteoarthritis, left knee: Secondary | ICD-10-CM | POA: Diagnosis not present

## 2019-08-04 DIAGNOSIS — N1831 Chronic kidney disease, stage 3a: Secondary | ICD-10-CM | POA: Diagnosis not present

## 2019-08-04 DIAGNOSIS — I10 Essential (primary) hypertension: Secondary | ICD-10-CM | POA: Diagnosis not present

## 2019-08-04 DIAGNOSIS — E7800 Pure hypercholesterolemia, unspecified: Secondary | ICD-10-CM | POA: Diagnosis not present

## 2019-08-04 DIAGNOSIS — E7849 Other hyperlipidemia: Secondary | ICD-10-CM | POA: Diagnosis not present

## 2019-08-19 DIAGNOSIS — M1712 Unilateral primary osteoarthritis, left knee: Secondary | ICD-10-CM | POA: Diagnosis not present

## 2019-08-26 DIAGNOSIS — M1712 Unilateral primary osteoarthritis, left knee: Secondary | ICD-10-CM | POA: Diagnosis not present

## 2019-09-02 DIAGNOSIS — M1712 Unilateral primary osteoarthritis, left knee: Secondary | ICD-10-CM | POA: Diagnosis not present

## 2019-09-03 ENCOUNTER — Other Ambulatory Visit: Payer: Self-pay | Admitting: Internal Medicine

## 2019-09-03 ENCOUNTER — Ambulatory Visit
Admission: RE | Admit: 2019-09-03 | Discharge: 2019-09-03 | Disposition: A | Discharge status: Home / Self Care | Payer: PPO | Source: Ambulatory Visit | Attending: Internal Medicine | Admitting: Internal Medicine

## 2019-09-03 DIAGNOSIS — R0789 Other chest pain: Secondary | ICD-10-CM

## 2019-09-03 DIAGNOSIS — R0781 Pleurodynia: Secondary | ICD-10-CM | POA: Diagnosis not present

## 2019-09-10 ENCOUNTER — Emergency Department (HOSPITAL_COMMUNITY): Payer: PPO

## 2019-09-10 ENCOUNTER — Encounter (HOSPITAL_COMMUNITY): Payer: Self-pay

## 2019-09-10 ENCOUNTER — Emergency Department (HOSPITAL_COMMUNITY)
Admission: EM | Admit: 2019-09-10 | Discharge: 2019-09-10 | Disposition: A | Discharge status: Home / Self Care | Payer: PPO | Attending: Emergency Medicine | Admitting: Emergency Medicine

## 2019-09-10 ENCOUNTER — Other Ambulatory Visit: Payer: Self-pay

## 2019-09-10 DIAGNOSIS — I10 Essential (primary) hypertension: Secondary | ICD-10-CM | POA: Insufficient documentation

## 2019-09-10 DIAGNOSIS — R109 Unspecified abdominal pain: Secondary | ICD-10-CM | POA: Diagnosis not present

## 2019-09-10 DIAGNOSIS — Z79899 Other long term (current) drug therapy: Secondary | ICD-10-CM | POA: Insufficient documentation

## 2019-09-10 DIAGNOSIS — N2889 Other specified disorders of kidney and ureter: Secondary | ICD-10-CM | POA: Diagnosis not present

## 2019-09-10 LAB — COMPREHENSIVE METABOLIC PANEL
ALT: 19 U/L (ref 0–44)
AST: 23 U/L (ref 15–41)
Albumin: 4.3 g/dL (ref 3.5–5.0)
Alkaline Phosphatase: 96 U/L (ref 38–126)
Anion gap: 13 (ref 5–15)
BUN: 16 mg/dL (ref 8–23)
CO2: 24 mmol/L (ref 22–32)
Calcium: 9.7 mg/dL (ref 8.9–10.3)
Chloride: 106 mmol/L (ref 98–111)
Creatinine, Ser: 1.01 mg/dL — ABNORMAL HIGH (ref 0.44–1.00)
GFR calc Af Amer: >60 mL/min (ref >60)
GFR calc non Af Amer: 54 mL/min — ABNORMAL LOW (ref >60)
Glucose, Bld: 112 mg/dL — ABNORMAL HIGH (ref 70–99)
Potassium: 3.5 mmol/L (ref 3.5–5.1)
Sodium: 143 mmol/L (ref 135–145)
Total Bilirubin: 1.1 mg/dL (ref 0.3–1.2)
Total Protein: 7.3 g/dL (ref 6.5–8.1)

## 2019-09-10 LAB — CBC
HCT: 41.9 % (ref 36.0–46.0)
Hemoglobin: 14.2 g/dL (ref 12.0–15.0)
MCH: 31.3 pg (ref 26.0–34.0)
MCHC: 33.9 g/dL (ref 30.0–36.0)
MCV: 92.5 fL (ref 80.0–100.0)
Platelets: 245 10*3/uL (ref 150–400)
RBC: 4.53 MIL/uL (ref 3.87–5.11)
RDW: 12.2 % (ref 11.5–15.5)
WBC: 6.1 10*3/uL (ref 4.0–10.5)
nRBC: 0 % (ref 0.0–0.2)

## 2019-09-10 LAB — URINALYSIS, ROUTINE W REFLEX MICROSCOPIC
Bacteria, UA: NONE SEEN
Bilirubin Urine: NEGATIVE
Glucose, UA: NEGATIVE mg/dL
Hgb urine dipstick: NEGATIVE
Ketones, ur: NEGATIVE mg/dL
Leukocytes,Ua: NEGATIVE
Nitrite: NEGATIVE
Protein, ur: 100 mg/dL — AB
Specific Gravity, Urine: 1.006 (ref 1.005–1.030)
pH: 8 (ref 5.0–8.0)

## 2019-09-10 MED ORDER — SODIUM CHLORIDE 0.9 % IV BOLUS
1000.0000 mL | Freq: Once | INTRAVENOUS | Status: AC
  Administered 2019-09-10: 1000 mL via INTRAVENOUS

## 2019-09-10 MED ORDER — ONDANSETRON 4 MG PO TBDP: 4.0000 mg | ORAL_TABLET | Freq: Three times a day (TID) | ORAL | 0 refills | Status: AC | PRN

## 2019-09-10 MED ORDER — SODIUM CHLORIDE 0.9 % IV SOLN: INTRAVENOUS | Status: DC

## 2019-09-10 MED ORDER — MORPHINE SULFATE (PF) 4 MG/ML IV SOLN
4.0000 mg | Freq: Once | INTRAVENOUS | Status: AC
  Administered 2019-09-10: 4 mg via INTRAVENOUS
  Filled 2019-09-10: qty 1

## 2019-09-10 MED ORDER — HYDROCODONE-ACETAMINOPHEN 5-325 MG PO TABS: 1.0000 | ORAL_TABLET | ORAL | 0 refills | Status: DC | PRN

## 2019-09-10 MED ORDER — ONDANSETRON HCL 4 MG/2ML IJ SOLN
4.0000 mg | Freq: Once | INTRAMUSCULAR | Status: AC
  Administered 2019-09-10: 4 mg via INTRAVENOUS
  Filled 2019-09-10: qty 2

## 2019-09-10 NOTE — ED Provider Notes (Signed)
[facility]-EMERGENCY DEPT Provider Note   CSN: [id number] Arrival date & time: 09/10/19  6553     History Chief Complaint  Patient presents with  . Flank Pain    Meredith Huff is a 79 y.o. female.  Pt presents to the ED today with left sided flank pain.  She has had pain for a week.  The pt did go to her pcp and was given cipro and tramadol.  The pt said that is not helping her pain.  Pt denies any sob, cough.  She has had a kidney stone in the past and this feels similar.        Past Medical History:  Diagnosis Date  . Hypercholesteremia   . Hypertension   . Stroke [facility]) 03/12/12   "can't control left arm"    Patient Active Problem List   Diagnosis Date Noted  . Cerebral artery occlusion with cerebral infarction (HCC) 02/17/2013  . Stroke (HCC) 03/12/2012    Class: Acute  . Hypertension 03/12/2012    Class: Chronic  . Dyslipidemia 03/12/2012    Class: Chronic    Past Surgical History:  Procedure Laterality Date  . BREAST LUMPECTOMY  1970's   right  . VAGINAL HYSTERECTOMY  1980's     OB History   No obstetric history on file.     Family History  Problem Relation Age of Onset  . Dementia Mother        Stroke, heart disease  . Heart attack Father     Social History   Tobacco Use  . Smoking status: Never Smoker  . Smokeless tobacco: Never Used  Substance Use Topics  . Alcohol use: No  . Drug use: No    Home Medications Prior to Admission medications   Medication Sig Start Date End Date Taking? Authorizing Provider  amLODipine (NORVASC) 5 MG tablet  06/22/13  Yes [provider]  atenolol (TENORMIN) 50 MG tablet Take 100 mg by mouth daily.    Yes [provider]  atorvastatin (LIPITOR) 10 MG tablet Take 10 mg by mouth daily.    Yes [provider]  cephALEXin (KEFLEX) 500 MG capsule Take 500 mg by mouth 3 (three) times daily. x10 days 07/02/16  Yes [provider]  clopidogrel (PLAVIX)  75 MG tablet Take 75 mg by mouth daily.  06/22/13  Yes [provider]  losartan (COZAAR) 100 MG tablet Take 100 mg by mouth daily. 08/02/19  Yes [provider]  Multiple Vitamin (MULITIVITAMIN WITH MINERALS) TABS Take 1 tablet by mouth daily.   Yes [provider]  HYDROcodone-acetaminophen (NORCO/VICODIN) 5-325 MG tablet Take 1 tablet by mouth every 4 (four) hours as needed. 09/10/19   Jacalyn Lefevre, MD  ondansetron (ZOFRAN ODT) 4 MG disintegrating tablet Take 1 tablet (4 mg total) by mouth every 8 (eight) hours as needed. 09/10/19   Jacalyn Lefevre, MD    Allergies    Patient has no known allergies.  Review of Systems   Review of Systems  Genitourinary: Positive for flank pain.  All other systems reviewed and are negative.   Physical Exam Updated Vital Signs BP (!) 177/73   Pulse (!) 49   Temp 97.6 F (36.4 C) (Oral)   Resp 16   SpO2 96%   Physical Exam Vitals and nursing note reviewed.  Constitutional:      Appearance: Normal appearance.  HENT:     Head: Normocephalic and atraumatic.     Right Ear: External ear  normal.     Left Ear: External ear normal.     Nose: Nose normal.     Mouth/Throat:     Mouth: Mucous membranes are moist.     Pharynx: Oropharynx is clear.  Eyes:     Extraocular Movements: Extraocular movements intact.     Conjunctiva/sclera: Conjunctivae normal.     Pupils: Pupils are equal, round, and reactive to light.  Cardiovascular:     Rate and Rhythm: Normal rate and regular rhythm.     Pulses: Normal pulses.     Heart sounds: Normal heart sounds.  Pulmonary:     Effort: Pulmonary effort is normal.     Breath sounds: Normal breath sounds.  Abdominal:     General: Abdomen is flat. Bowel sounds are normal.     Palpations: Abdomen is soft.  Musculoskeletal:        General: Normal range of motion.     Cervical back: Normal range of motion and neck supple.  Skin:    General: Skin is warm.     Capillary Refill:  Capillary refill takes less than 2 seconds.  Neurological:     General: No focal deficit present.     Mental Status: She is alert and oriented to person, place, and time.  Psychiatric:        Mood and Affect: Mood normal.        Behavior: Behavior normal.        Thought Content: Thought content normal.        Judgment: Judgment normal.     ED Results / Procedures / Treatments   Labs (all labs ordered are listed, but only abnormal results are displayed) Labs Reviewed  URINALYSIS, ROUTINE W REFLEX MICROSCOPIC - Abnormal; Notable for the following components:      Result Value   Color, Urine STRAW (*)    Protein, ur 100 (*)    All other components within normal limits  COMPREHENSIVE METABOLIC PANEL - Abnormal; Notable for the following components:   Glucose, Bld 112 (*)    Creatinine, Ser 1.01 (*)    GFR calc non Af Amer 54 (*)    All other components within normal limits  CBC    EKG None  Radiology CT RENAL STONE STUDY  Result Date: 09/10/2019 CLINICAL DATA:  Pt presents with c/o left side flank pain. Pt reports that she has had flank pain for over a week on the left side, sometimes radiating to the left front side. Pt reports that she has been to her MD and had some x-rays done and they were unable to obtain a definitive diagnosis. Pt denies any hematuria. EXAM: CT ABDOMEN AND PELVIS WITHOUT CONTRAST TECHNIQUE: Multidetector CT imaging of the abdomen and pelvis was performed following the standard protocol without IV contrast. COMPARISON:  None. FINDINGS: Lower chest: No acute abnormality. Hepatobiliary: No focal liver abnormality is seen. No gallstones, gallbladder wall thickening, or biliary dilatation. Pancreas: Unremarkable. No pancreatic ductal dilatation or surrounding inflammatory changes. Spleen: Normal in size without focal abnormality. Adrenals/Urinary Tract: No adrenal masses. Kidneys normal in size, orientation and position. 1.3 cm hyperattenuating lesion arises from the  upper pole of the left kidney, average Hounsfield units of 58, consistent with a mildly complicated cyst. There is a 1 cm low-attenuation renal sinus mass in the midpole of the left kidney consistent with a cyst. No other renal masses or lesions, no stones and no hydronephrosis. Normal ureters.  Normal bladder. Stomach/Bowel: Stomach is within normal limits. Appendix  appears normal. No evidence of bowel wall thickening, distention, or inflammatory changes. Vascular/Lymphatic: Aortic atherosclerotic calcifications. No aneurysm. No enlarged lymph nodes. Reproductive: Status post hysterectomy. No adnexal masses. Other: No abdominal wall hernia or abnormality. No abdominopelvic ascites. Musculoskeletal: No fracture or acute finding. No osteoblastic or osteolytic lesions. IMPRESSION: 1. No acute findings. No findings to account for left flank pain. No renal or ureteral stones or obstructive uropathy. 2. 1.3 cm hyperattenuating lesion in the upper pole the left kidney most likely a cyst complicated by proteinaceous contents or prior hemorrhage. Small left renal sinus low-attenuation mass consistent with a cyst. 3. Aortic atherosclerosis. Electronically Signed   By: Amie Portland M.D.   On: 09/10/2019 09:53    Procedures Procedures (including critical care time)  Medications Ordered in ED Medications  sodium chloride 0.9 % bolus 1,000 mL (0 mLs Intravenous Stopped 09/10/19 1356)    And  0.9 %  sodium chloride infusion (has no administration in time range)  morphine 4 MG/ML injection 4 mg (has no administration in time range)  morphine 4 MG/ML injection 4 mg (4 mg Intravenous Given 09/10/19 0935)  ondansetron (ZOFRAN) injection 4 mg (4 mg Intravenous Given 09/10/19 0934)    ED Course  I have reviewed the triage vital signs and the nursing notes.  Pertinent labs & imaging results that were available during my care of the patient were reviewed by me and considered in my medical decision making (see chart for  details).    MDM Rules/Calculators/A&P                      Pt is feeling much better.  CT renal shows no evidence for a kidney stone.  UA nl.  Pt is told to stop taking tramadol.  She's given a rx for lortab.  Return if worse.   Final Clinical Impression(s) / ED Diagnoses Final diagnoses:  Left flank pain    Rx / DC Orders ED Discharge Orders         Ordered    HYDROcodone-acetaminophen (NORCO/VICODIN) 5-325 MG tablet  Every 4 hours PRN     09/10/19 1442    ondansetron (ZOFRAN ODT) 4 MG disintegrating tablet  Every 8 hours PRN     09/10/19 1442           Jacalyn Lefevre, MD 09/10/19 1443

## 2019-09-10 NOTE — ED Triage Notes (Signed)
Pt presents with c/o left side flank pain. Pt reports that she has had flank pain for over a week on the left side, sometimes radiating to the left front side. Pt reports that she has been to her MD and had some x-rays done and they were unable to obtain a definitive diagnosis. Pt denies any hematuria.

## 2019-09-10 NOTE — ED Notes (Signed)
[facility]AC[facility] 

## 2019-09-14 DIAGNOSIS — M545 Low back pain: Secondary | ICD-10-CM | POA: Diagnosis not present

## 2019-09-14 DIAGNOSIS — R109 Unspecified abdominal pain: Secondary | ICD-10-CM | POA: Diagnosis not present

## 2019-09-15 ENCOUNTER — Other Ambulatory Visit: Payer: Self-pay

## 2019-09-15 ENCOUNTER — Encounter (HOSPITAL_COMMUNITY): Payer: Self-pay | Admitting: *Deleted

## 2019-09-15 ENCOUNTER — Emergency Department (HOSPITAL_COMMUNITY)
Admission: EM | Admit: 2019-09-15 | Discharge: 2019-09-15 | Disposition: A | Discharge status: Home / Self Care | Payer: PPO | Attending: Emergency Medicine | Admitting: Emergency Medicine

## 2019-09-15 DIAGNOSIS — Z8673 Personal history of transient ischemic attack (TIA), and cerebral infarction without residual deficits: Secondary | ICD-10-CM | POA: Insufficient documentation

## 2019-09-15 DIAGNOSIS — M25552 Pain in left hip: Secondary | ICD-10-CM | POA: Diagnosis not present

## 2019-09-15 DIAGNOSIS — I10 Essential (primary) hypertension: Secondary | ICD-10-CM | POA: Insufficient documentation

## 2019-09-15 DIAGNOSIS — Z79899 Other long term (current) drug therapy: Secondary | ICD-10-CM | POA: Diagnosis not present

## 2019-09-15 LAB — URINALYSIS, ROUTINE W REFLEX MICROSCOPIC
Bacteria, UA: NONE SEEN
Bilirubin Urine: NEGATIVE
Glucose, UA: NEGATIVE mg/dL
Hgb urine dipstick: NEGATIVE
Ketones, ur: NEGATIVE mg/dL
Nitrite: NEGATIVE
Protein, ur: 30 mg/dL — AB
Specific Gravity, Urine: 1.012 (ref 1.005–1.030)
pH: 6 (ref 5.0–8.0)

## 2019-09-15 MED ORDER — LIDOCAINE 5 % EX PTCH
1.0000 | MEDICATED_PATCH | CUTANEOUS | Status: DC
  Administered 2019-09-15: 1 via TRANSDERMAL
  Filled 2019-09-15: qty 1

## 2019-09-15 MED ORDER — MORPHINE SULFATE (PF) 4 MG/ML IV SOLN
4.0000 mg | Freq: Once | INTRAVENOUS | Status: AC
  Administered 2019-09-15: 4 mg via INTRAMUSCULAR
  Filled 2019-09-15: qty 1

## 2019-09-15 MED ORDER — HYDROCODONE-ACETAMINOPHEN 5-325 MG PO TABS: 1.0000 | ORAL_TABLET | Freq: Four times a day (QID) | ORAL | 0 refills | Status: AC | PRN

## 2019-09-15 MED ORDER — LIDOCAINE 5 % EX PTCH: 1.0000 | MEDICATED_PATCH | Freq: Two times a day (BID) | CUTANEOUS | 0 refills | Status: AC

## 2019-09-15 NOTE — ED Triage Notes (Signed)
Patient reports left hip and left sided lower back pain x 3 weeks.  Reports that she was treated here last week for same, states medications are not helping her

## 2019-09-15 NOTE — TOC Initial Note (Addendum)
Transition of Care [facility]) - Initial/Assessment Note    Patient Details  Name: Meredith Huff MRN: [id number] Date of Birth: 03-Aug-1941  [facility] CM/SW Contact:    Elliot Cousin, RN Phone Number:  [phone] 09/15/2019, 2:42 PM  Clinical Narrative:                 TOC CM spoke to pt at bedside. Pt states she lives in home with husband. Offered to order pt DME ie RW, cane. States she has family that has plenty of RW and cane. She declined RWs or canes. She is in agreement to [facility] at home. Offered choice (list mailed). Agreeable to [facility].    Contacted [employer] rep, [place]. States start of care will be 09/19/2019. Will update pt on start of care and HH contact information.   Expected Discharge Plan: Home w Home Health Services Barriers to Discharge: No Barriers Identified   Patient Goals and CMS Choice Patient states their goals for this hospitalization and ongoing recovery are:: want the pain to subside CMS Medicare.gov Compare Post Acute Care list provided to:: Patient Choice offered to / list presented to : Patient  Expected Discharge Plan and Services Expected Discharge Plan: Home w Home Health Services In-house Referral: Clinical Social Work Discharge Planning Services: CM Consult Post Acute Care Choice: Home Health Living arrangements for the past 2 months: Single Family Home                           HH Arranged: PT, OT [facility] Agency: [facility] Date [facility] Agency Contacted: 09/15/19 Time HH Agency Contacted: 1438 Representative spoke with at [facility] Agency: Sanjuana Letters  Prior Living Arrangements/Services Living arrangements for the past 2 months: Single Family Home Lives with:: Spouse Patient language and need for interpreter reviewed:: Yes Do you feel safe going back to the place where you live?: Yes      Need for Family Participation in Patient Care: No (Comment) Care giver support system in place?: Yes (comment) Current home  services: DME(can borrow RW, or cane) Criminal Activity/Legal Involvement Pertinent to Current Situation/Hospitalization: No - Comment as needed  Activities of Daily Living      Permission Sought/Granted Permission sought to share information with : Case Manager, PCP, Family Supports Permission granted to share information with : Yes, Verbal Permission Granted  Share Information with NAME: [name]  Permission granted to share info w AGENCY: Home Health  Permission granted to share info w Relationship: husband  Permission granted to share info w Contact Information: [phone]  Emotional Assessment Appearance:: Appears stated age Attitude/Demeanor/Rapport: Engaged Affect (typically observed): Accepting Orientation: : Oriented to Self, Oriented to Place, Oriented to Situation, Oriented to  Time   Psych Involvement: No (comment)  Admission diagnosis:  flank pain to hip Patient Active Problem List   Diagnosis Date Noted  . Cerebral artery occlusion with cerebral infarction (HCC) 02/17/2013  . Stroke (HCC) 03/12/2012    Class: Acute  . Hypertension 03/12/2012    Class: Chronic  . Dyslipidemia 03/12/2012    Class: Chronic   PCP:  Renford Dills, MD Pharmacy:   [facility] [address] [place] - [address] [place] [address] Phone: [phone] Fax: [phone]     Social Determinants of Health (SDOH) Interventions    Readmission Risk Interventions No flowsheet data found.

## 2019-09-15 NOTE — ED Notes (Signed)
Pt ambulated in hallway without difficulty. No assistance required. Pt moved to triage 6 to await SW. Report given to [facility] blanket provided

## 2019-09-15 NOTE — Discharge Instructions (Addendum)
To Meredith Huff,  Thank you for choosing [facility] for your healthcare maintenance. You came to the ED with left hip pain. During your examination, your physical findings were consistent with sciatica. You will be discharged home with home health services, lidocaine patch, and norco/vicoden 5-325 mg. Please take the medications as indicated on the label. Please come back to the ED if your experiencing an acute increase in pain, loss of sensation in the groin region, or weakness in your legs.

## 2019-09-15 NOTE — ED Provider Notes (Signed)
[facility] DEPT Provider Note   CSN: [id number] Arrival date & time: 09/15/19  0630     History Chief Complaint  Patient presents with  . Hip Pain    Meredith Huff is a 79 y.o. female.  Meredith Huff is a 79 y/o female, with a PMH of HTN and stroke, who presents to [facility] with left hip pain. Patient states that she had a previous stay in the ED on 09/09/2018 for the same complaint. She states that her hip pain intially started 2 weeks ago after getting a "gel" shot in her left knee. She states that the pain is "burning" in character, and constant in duration. Occasionally, the pain will radiate from her upper left hip to the superior aspect of her thigh. She states that her pain is an 8/10. Nothing aggravates her pain. Heating pads alleviates her pain to a 6/10, but the muscle relaxants and Norco she received on 09/10/19 did not alleviate her symptoms.   During her last ED course, her labs were unremarkable, and there were no findings on her CT renal stone study.         Past Medical History:  Diagnosis Date  . Hypercholesteremia   . Hypertension   . Stroke [facility]) 03/12/12   "can't control left arm"    Patient Active Problem List   Diagnosis Date Noted  . Cerebral artery occlusion with cerebral infarction ([place]) 02/17/2013  . Stroke ([place]) 03/12/2012    Class: Acute  . Hypertension 03/12/2012    Class: Chronic  . Dyslipidemia 03/12/2012    Class: Chronic    Past Surgical History:  Procedure Laterality Date  . BREAST LUMPECTOMY  1970's   right  . VAGINAL HYSTERECTOMY  1980's     OB History   No obstetric history on file.     Family History  Problem Relation Age of Onset  . Dementia Mother        Stroke, heart disease  . Heart attack Father     Social History   Tobacco Use  . Smoking status: Never Smoker  . Smokeless tobacco: Never Used  Substance Use Topics  . Alcohol use: No  . Drug use: No    Home Medications Prior  to Admission medications   Medication Sig Start Date End Date Taking? Authorizing Provider  acetaminophen (TYLENOL) 500 MG tablet Take 500 mg by mouth every 6 (six) hours as needed for mild pain or moderate pain.   Yes [provider]  amLODipine (NORVASC) 5 MG tablet Take 5 mg by mouth daily.  06/22/13  Yes [provider]  atenolol (TENORMIN) 50 MG tablet Take 100 mg by mouth daily.    Yes [provider]  atorvastatin (LIPITOR) 10 MG tablet Take 10 mg by mouth daily.    Yes [provider]  clopidogrel (PLAVIX) 75 MG tablet Take 75 mg by mouth daily.  06/22/13  Yes [provider]  losartan (COZAAR) 100 MG tablet Take 100 mg by mouth daily. 08/02/19  Yes [provider]  Multiple Vitamin (MULITIVITAMIN WITH MINERALS) TABS Take 1 tablet by mouth daily.   Yes [provider]  HYDROcodone-acetaminophen (NORCO/VICODIN) 5-325 MG tablet Take 1 tablet by mouth every 4 (four) hours as needed. Patient not taking: Reported on 09/15/2019 09/10/19   Isla Pence, MD  ondansetron (ZOFRAN ODT) 4 MG disintegrating tablet Take 1 tablet (4 mg total) by mouth every 8 (eight) hours as needed. Patient not taking: Reported on 09/15/2019  09/10/19   Jacalyn Lefevre, MD    Allergies    Patient has no known allergies.  Review of Systems   Review of Systems  Constitutional: Negative for chills, diaphoresis, fatigue and fever.  Respiratory: Negative for chest tightness and shortness of breath.   Cardiovascular: Negative for chest pain.  Gastrointestinal: Negative for abdominal distention, abdominal pain, constipation, diarrhea, nausea and vomiting.  Endocrine: Negative for polyuria.  Genitourinary: Positive for flank pain. Negative for decreased urine volume, dysuria, frequency, pelvic pain and urgency.  Musculoskeletal: Negative for arthralgias, back pain and joint swelling.  Neurological: Negative for dizziness and headaches.    Physical  Exam Updated Vital Signs BP (!) 153/100 (BP Location: Left Arm)   Pulse 70   Temp 97.8 F (36.6 C) (Oral)   Ht 5\' 2"  (1.575 m)   Wt 82.1 kg   SpO2 98%   BMI 33.11 kg/m   Physical Exam Vitals and nursing note reviewed.  Constitutional:      Appearance: She is not ill-appearing, toxic-appearing or diaphoretic.     Comments: Patient generally appears uncomfortable with constant movement in the bed.  HENT:     Head: Normocephalic and atraumatic.     Nose: Nose normal.  Cardiovascular:     Rate and Rhythm: Normal rate and regular rhythm.     Pulses: Normal pulses.     Heart sounds: Normal heart sounds. No murmur. No friction rub. No gallop.   Pulmonary:     Effort: Pulmonary effort is normal.     Breath sounds: Normal breath sounds. No wheezing, rhonchi or rales.  Abdominal:     General: Abdomen is flat. Bowel sounds are normal.     Palpations: Abdomen is soft.     Tenderness: There is no abdominal tenderness. There is no right CVA tenderness, left CVA tenderness, guarding or rebound.  Musculoskeletal:        General: Tenderness present. No swelling, deformity or signs of injury. Normal range of motion.     Comments: Hips with full range of motion with no pain illicite during the examination.  Tenderness elicited at the superior aspect of the L buttock with no radiation.  No tenderness along the spine or paraspinal musculature.   Skin:    General: Skin is warm and dry.     Findings: No bruising, erythema or rash.  Neurological:     General: No focal deficit present.     Mental Status: She is alert and oriented to person, place, and time.  Psychiatric:        Mood and Affect: Mood normal.        Behavior: Behavior normal.        Thought Content: Thought content normal.     ED Results / Procedures / Treatments   Labs (all labs ordered are listed, but only abnormal results are displayed) Labs Reviewed - No data to display  EKG None  Radiology No results  found.  Procedures Procedures (including critical care time)  Medications Ordered in ED Medications - No data to display  ED Course  I have reviewed the triage vital signs and the nursing notes.  Pertinent labs & imaging results that were available during my care of the patient were reviewed by me and considered in my medical decision making (see chart for details).    MDM Rules/Calculators/A&P                      Final Clinical Impression(s) / ED  Diagnoses Final diagnoses:  None   Patient is a 79 y/o female, with a PMH of HTN and stroke, who presents with L hip pain. Pain has not worsened since her last visit, but the patient has had no alleviation in her pain control. She has had imaging of her hip last week in the outpatient setting, with no changes concerning for fractures or necrosis of the trochanter.  Her lab work shows an unremarkable U/A result. On physical examination, her range of motion is within normal limits with no pain elicited, which lowers the presence of a hip fracture on the differential. She does have tenderness in the superior aspect of the buttock, with no pain elicited on palpation of the spine. She was started on lidocaine 5% patch with morphine 4 mg, with alleviation of her symptoms. Patient is stable from an ED standpoint to be D/C to home. Plan:  - D/C home  - ordered norco/vicoden 5-325 mg tab - Ordered lidocaine patch 5% - Home health services are being arranged by SW.  Rx / DC Orders ED Discharge Orders    None       Dolan Amen, MD 09/15/19 1314    Maia Plan, MD 09/16/19 [phone]

## 2019-09-15 NOTE — ED Notes (Signed)
Pt verbalizes understanding of DC instructions. Pt belongings returned and is ambulatory out of ED. Pt used room phone to call brother for a ride

## 2019-09-19 DIAGNOSIS — M13862 Other specified arthritis, left knee: Secondary | ICD-10-CM | POA: Diagnosis not present

## 2019-09-19 DIAGNOSIS — I10 Essential (primary) hypertension: Secondary | ICD-10-CM | POA: Diagnosis not present

## 2019-09-19 DIAGNOSIS — E7800 Pure hypercholesterolemia, unspecified: Secondary | ICD-10-CM | POA: Diagnosis not present

## 2019-09-19 DIAGNOSIS — Z7902 Long term (current) use of antithrombotics/antiplatelets: Secondary | ICD-10-CM | POA: Diagnosis not present

## 2019-09-19 DIAGNOSIS — Z9181 History of falling: Secondary | ICD-10-CM | POA: Diagnosis not present

## 2019-09-19 DIAGNOSIS — Z8673 Personal history of transient ischemic attack (TIA), and cerebral infarction without residual deficits: Secondary | ICD-10-CM | POA: Diagnosis not present

## 2019-09-22 DIAGNOSIS — M5432 Sciatica, left side: Secondary | ICD-10-CM | POA: Diagnosis not present

## 2019-09-23 DIAGNOSIS — I10 Essential (primary) hypertension: Secondary | ICD-10-CM | POA: Diagnosis not present

## 2019-09-23 DIAGNOSIS — Z9181 History of falling: Secondary | ICD-10-CM | POA: Diagnosis not present

## 2019-09-23 DIAGNOSIS — Z7902 Long term (current) use of antithrombotics/antiplatelets: Secondary | ICD-10-CM | POA: Diagnosis not present

## 2019-09-23 DIAGNOSIS — Z8673 Personal history of transient ischemic attack (TIA), and cerebral infarction without residual deficits: Secondary | ICD-10-CM | POA: Diagnosis not present

## 2019-09-23 DIAGNOSIS — E7800 Pure hypercholesterolemia, unspecified: Secondary | ICD-10-CM | POA: Diagnosis not present

## 2019-09-23 DIAGNOSIS — M13862 Other specified arthritis, left knee: Secondary | ICD-10-CM | POA: Diagnosis not present

## 2019-09-30 DIAGNOSIS — E7800 Pure hypercholesterolemia, unspecified: Secondary | ICD-10-CM | POA: Diagnosis not present

## 2019-09-30 DIAGNOSIS — I10 Essential (primary) hypertension: Secondary | ICD-10-CM | POA: Diagnosis not present

## 2019-09-30 DIAGNOSIS — M13862 Other specified arthritis, left knee: Secondary | ICD-10-CM | POA: Diagnosis not present

## 2019-09-30 DIAGNOSIS — Z9181 History of falling: Secondary | ICD-10-CM | POA: Diagnosis not present

## 2019-09-30 DIAGNOSIS — Z8673 Personal history of transient ischemic attack (TIA), and cerebral infarction without residual deficits: Secondary | ICD-10-CM | POA: Diagnosis not present

## 2019-09-30 DIAGNOSIS — Z7902 Long term (current) use of antithrombotics/antiplatelets: Secondary | ICD-10-CM | POA: Diagnosis not present

## 2019-11-17 DIAGNOSIS — Z1389 Encounter for screening for other disorder: Secondary | ICD-10-CM | POA: Diagnosis not present

## 2019-11-17 DIAGNOSIS — I10 Essential (primary) hypertension: Secondary | ICD-10-CM | POA: Diagnosis not present

## 2019-11-17 DIAGNOSIS — M8588 Other specified disorders of bone density and structure, other site: Secondary | ICD-10-CM | POA: Diagnosis not present

## 2019-11-17 DIAGNOSIS — E663 Overweight: Secondary | ICD-10-CM | POA: Diagnosis not present

## 2019-11-17 DIAGNOSIS — N1831 Chronic kidney disease, stage 3a: Secondary | ICD-10-CM | POA: Diagnosis not present

## 2019-11-17 DIAGNOSIS — E7800 Pure hypercholesterolemia, unspecified: Secondary | ICD-10-CM | POA: Diagnosis not present

## 2019-11-17 DIAGNOSIS — Z0000 Encounter for general adult medical examination without abnormal findings: Secondary | ICD-10-CM | POA: Diagnosis not present

## 2019-11-17 DIAGNOSIS — I679 Cerebrovascular disease, unspecified: Secondary | ICD-10-CM | POA: Diagnosis not present

## 2019-11-29 DIAGNOSIS — N183 Chronic kidney disease, stage 3 unspecified: Secondary | ICD-10-CM | POA: Diagnosis not present

## 2019-11-29 DIAGNOSIS — E7800 Pure hypercholesterolemia, unspecified: Secondary | ICD-10-CM | POA: Diagnosis not present

## 2019-11-29 DIAGNOSIS — N182 Chronic kidney disease, stage 2 (mild): Secondary | ICD-10-CM | POA: Diagnosis not present

## 2019-11-29 DIAGNOSIS — I10 Essential (primary) hypertension: Secondary | ICD-10-CM | POA: Diagnosis not present

## 2019-11-29 DIAGNOSIS — E7849 Other hyperlipidemia: Secondary | ICD-10-CM | POA: Diagnosis not present

## 2020-04-12 DIAGNOSIS — E7800 Pure hypercholesterolemia, unspecified: Secondary | ICD-10-CM | POA: Diagnosis not present

## 2020-04-12 DIAGNOSIS — N183 Chronic kidney disease, stage 3 unspecified: Secondary | ICD-10-CM | POA: Diagnosis not present

## 2020-04-12 DIAGNOSIS — E7849 Other hyperlipidemia: Secondary | ICD-10-CM | POA: Diagnosis not present

## 2020-04-12 DIAGNOSIS — I10 Essential (primary) hypertension: Secondary | ICD-10-CM | POA: Diagnosis not present

## 2020-04-12 DIAGNOSIS — N182 Chronic kidney disease, stage 2 (mild): Secondary | ICD-10-CM | POA: Diagnosis not present

## 2020-05-18 DIAGNOSIS — E663 Overweight: Secondary | ICD-10-CM | POA: Diagnosis not present

## 2020-05-18 DIAGNOSIS — M8588 Other specified disorders of bone density and structure, other site: Secondary | ICD-10-CM | POA: Diagnosis not present

## 2020-05-18 DIAGNOSIS — N182 Chronic kidney disease, stage 2 (mild): Secondary | ICD-10-CM | POA: Diagnosis not present

## 2020-05-18 DIAGNOSIS — Z8673 Personal history of transient ischemic attack (TIA), and cerebral infarction without residual deficits: Secondary | ICD-10-CM | POA: Diagnosis not present

## 2020-05-18 DIAGNOSIS — I10 Essential (primary) hypertension: Secondary | ICD-10-CM | POA: Diagnosis not present

## 2020-05-18 DIAGNOSIS — Z23 Encounter for immunization: Secondary | ICD-10-CM | POA: Diagnosis not present

## 2020-05-18 DIAGNOSIS — E7800 Pure hypercholesterolemia, unspecified: Secondary | ICD-10-CM | POA: Diagnosis not present

## 2020-05-23 DIAGNOSIS — Z03818 Encounter for observation for suspected exposure to other biological agents ruled out: Secondary | ICD-10-CM | POA: Diagnosis not present

## 2020-05-23 DIAGNOSIS — Z20822 Contact with and (suspected) exposure to covid-19: Secondary | ICD-10-CM | POA: Diagnosis not present

## 2020-06-01 DIAGNOSIS — Z1231 Encounter for screening mammogram for malignant neoplasm of breast: Secondary | ICD-10-CM | POA: Diagnosis not present

## 2020-06-11 ENCOUNTER — Other Ambulatory Visit: Payer: Self-pay

## 2020-06-11 ENCOUNTER — Ambulatory Visit: Payer: PPO | Attending: Internal Medicine

## 2020-06-11 DIAGNOSIS — Z23 Encounter for immunization: Secondary | ICD-10-CM

## 2020-06-11 NOTE — Progress Notes (Signed)
° °  Covid-19 Vaccination Clinic  Name:  IRIS HAIRSTON    MRN: [id number] DOB: 04/18/1941  06/11/2020  Ms. Dormer was observed post Covid-19 immunization for 15 minutes without incident. She was provided with Vaccine Information Sheet and instruction to access the V-Safe system.   Ms. Dazey was instructed to call 911 with any severe reactions post vaccine:  Difficulty breathing   Swelling of face and throat   A fast heartbeat   A bad rash all over body   Dizziness and weakness

## 2020-08-01 DIAGNOSIS — E7800 Pure hypercholesterolemia, unspecified: Secondary | ICD-10-CM | POA: Diagnosis not present

## 2020-08-01 DIAGNOSIS — N183 Chronic kidney disease, stage 3 unspecified: Secondary | ICD-10-CM | POA: Diagnosis not present

## 2020-08-01 DIAGNOSIS — N182 Chronic kidney disease, stage 2 (mild): Secondary | ICD-10-CM | POA: Diagnosis not present

## 2020-08-01 DIAGNOSIS — I10 Essential (primary) hypertension: Secondary | ICD-10-CM | POA: Diagnosis not present

## 2020-09-28 DIAGNOSIS — E7800 Pure hypercholesterolemia, unspecified: Secondary | ICD-10-CM | POA: Diagnosis not present

## 2020-09-28 DIAGNOSIS — N183 Chronic kidney disease, stage 3 unspecified: Secondary | ICD-10-CM | POA: Diagnosis not present

## 2020-09-28 DIAGNOSIS — I10 Essential (primary) hypertension: Secondary | ICD-10-CM | POA: Diagnosis not present

## 2020-09-28 DIAGNOSIS — N182 Chronic kidney disease, stage 2 (mild): Secondary | ICD-10-CM | POA: Diagnosis not present

## 2020-11-21 DIAGNOSIS — Z0000 Encounter for general adult medical examination without abnormal findings: Secondary | ICD-10-CM | POA: Diagnosis not present

## 2020-11-21 DIAGNOSIS — N1831 Chronic kidney disease, stage 3a: Secondary | ICD-10-CM | POA: Diagnosis not present

## 2020-11-21 DIAGNOSIS — Z23 Encounter for immunization: Secondary | ICD-10-CM | POA: Diagnosis not present

## 2020-11-21 DIAGNOSIS — E7800 Pure hypercholesterolemia, unspecified: Secondary | ICD-10-CM | POA: Diagnosis not present

## 2020-11-21 DIAGNOSIS — I10 Essential (primary) hypertension: Secondary | ICD-10-CM | POA: Diagnosis not present

## 2020-11-21 DIAGNOSIS — E663 Overweight: Secondary | ICD-10-CM | POA: Diagnosis not present

## 2020-11-21 DIAGNOSIS — Z8673 Personal history of transient ischemic attack (TIA), and cerebral infarction without residual deficits: Secondary | ICD-10-CM | POA: Diagnosis not present

## 2020-11-21 DIAGNOSIS — Z1389 Encounter for screening for other disorder: Secondary | ICD-10-CM | POA: Diagnosis not present

## 2020-12-21 DIAGNOSIS — N183 Chronic kidney disease, stage 3 unspecified: Secondary | ICD-10-CM | POA: Diagnosis not present

## 2020-12-21 DIAGNOSIS — I10 Essential (primary) hypertension: Secondary | ICD-10-CM | POA: Diagnosis not present

## 2020-12-21 DIAGNOSIS — E7800 Pure hypercholesterolemia, unspecified: Secondary | ICD-10-CM | POA: Diagnosis not present

## 2021-02-14 DIAGNOSIS — N183 Chronic kidney disease, stage 3 unspecified: Secondary | ICD-10-CM | POA: Diagnosis not present

## 2021-02-14 DIAGNOSIS — N1831 Chronic kidney disease, stage 3a: Secondary | ICD-10-CM | POA: Diagnosis not present

## 2021-02-14 DIAGNOSIS — E7800 Pure hypercholesterolemia, unspecified: Secondary | ICD-10-CM | POA: Diagnosis not present

## 2021-02-14 DIAGNOSIS — I10 Essential (primary) hypertension: Secondary | ICD-10-CM | POA: Diagnosis not present

## 2021-02-14 DIAGNOSIS — N182 Chronic kidney disease, stage 2 (mild): Secondary | ICD-10-CM | POA: Diagnosis not present

## 2021-05-02 DIAGNOSIS — N1831 Chronic kidney disease, stage 3a: Secondary | ICD-10-CM | POA: Diagnosis not present

## 2021-05-02 DIAGNOSIS — I10 Essential (primary) hypertension: Secondary | ICD-10-CM | POA: Diagnosis not present

## 2021-05-02 DIAGNOSIS — E7800 Pure hypercholesterolemia, unspecified: Secondary | ICD-10-CM | POA: Diagnosis not present

## 2021-05-11 DIAGNOSIS — Z23 Encounter for immunization: Secondary | ICD-10-CM | POA: Diagnosis not present

## 2021-05-23 DIAGNOSIS — E663 Overweight: Secondary | ICD-10-CM | POA: Diagnosis not present

## 2021-05-23 DIAGNOSIS — I10 Essential (primary) hypertension: Secondary | ICD-10-CM | POA: Diagnosis not present

## 2021-05-23 DIAGNOSIS — E7800 Pure hypercholesterolemia, unspecified: Secondary | ICD-10-CM | POA: Diagnosis not present

## 2021-05-23 DIAGNOSIS — N1831 Chronic kidney disease, stage 3a: Secondary | ICD-10-CM | POA: Diagnosis not present

## 2021-05-23 DIAGNOSIS — Z1211 Encounter for screening for malignant neoplasm of colon: Secondary | ICD-10-CM | POA: Diagnosis not present

## 2021-05-23 DIAGNOSIS — M8580 Other specified disorders of bone density and structure, unspecified site: Secondary | ICD-10-CM | POA: Diagnosis not present

## 2021-05-25 ENCOUNTER — Other Ambulatory Visit: Payer: Self-pay | Admitting: Internal Medicine

## 2021-05-25 DIAGNOSIS — M8580 Other specified disorders of bone density and structure, unspecified site: Secondary | ICD-10-CM

## 2021-06-05 DIAGNOSIS — Z1231 Encounter for screening mammogram for malignant neoplasm of breast: Secondary | ICD-10-CM | POA: Diagnosis not present

## 2021-07-26 DIAGNOSIS — E7800 Pure hypercholesterolemia, unspecified: Secondary | ICD-10-CM | POA: Diagnosis not present

## 2021-07-26 DIAGNOSIS — I10 Essential (primary) hypertension: Secondary | ICD-10-CM | POA: Diagnosis not present

## 2021-07-26 DIAGNOSIS — N1831 Chronic kidney disease, stage 3a: Secondary | ICD-10-CM | POA: Diagnosis not present

## 2021-09-10 IMAGING — CR DG RIBS W/ CHEST 3+V*L*
3 series · 3 of 3 positions shown · non-contrast
Comparison: Chest x-ray 03/13/2012

CLINICAL DATA: Left-sided rib pain for 1 day. No known injury.

EXAM:
LEFT RIBS AND CHEST - 3+ VIEW

[w chest pa]
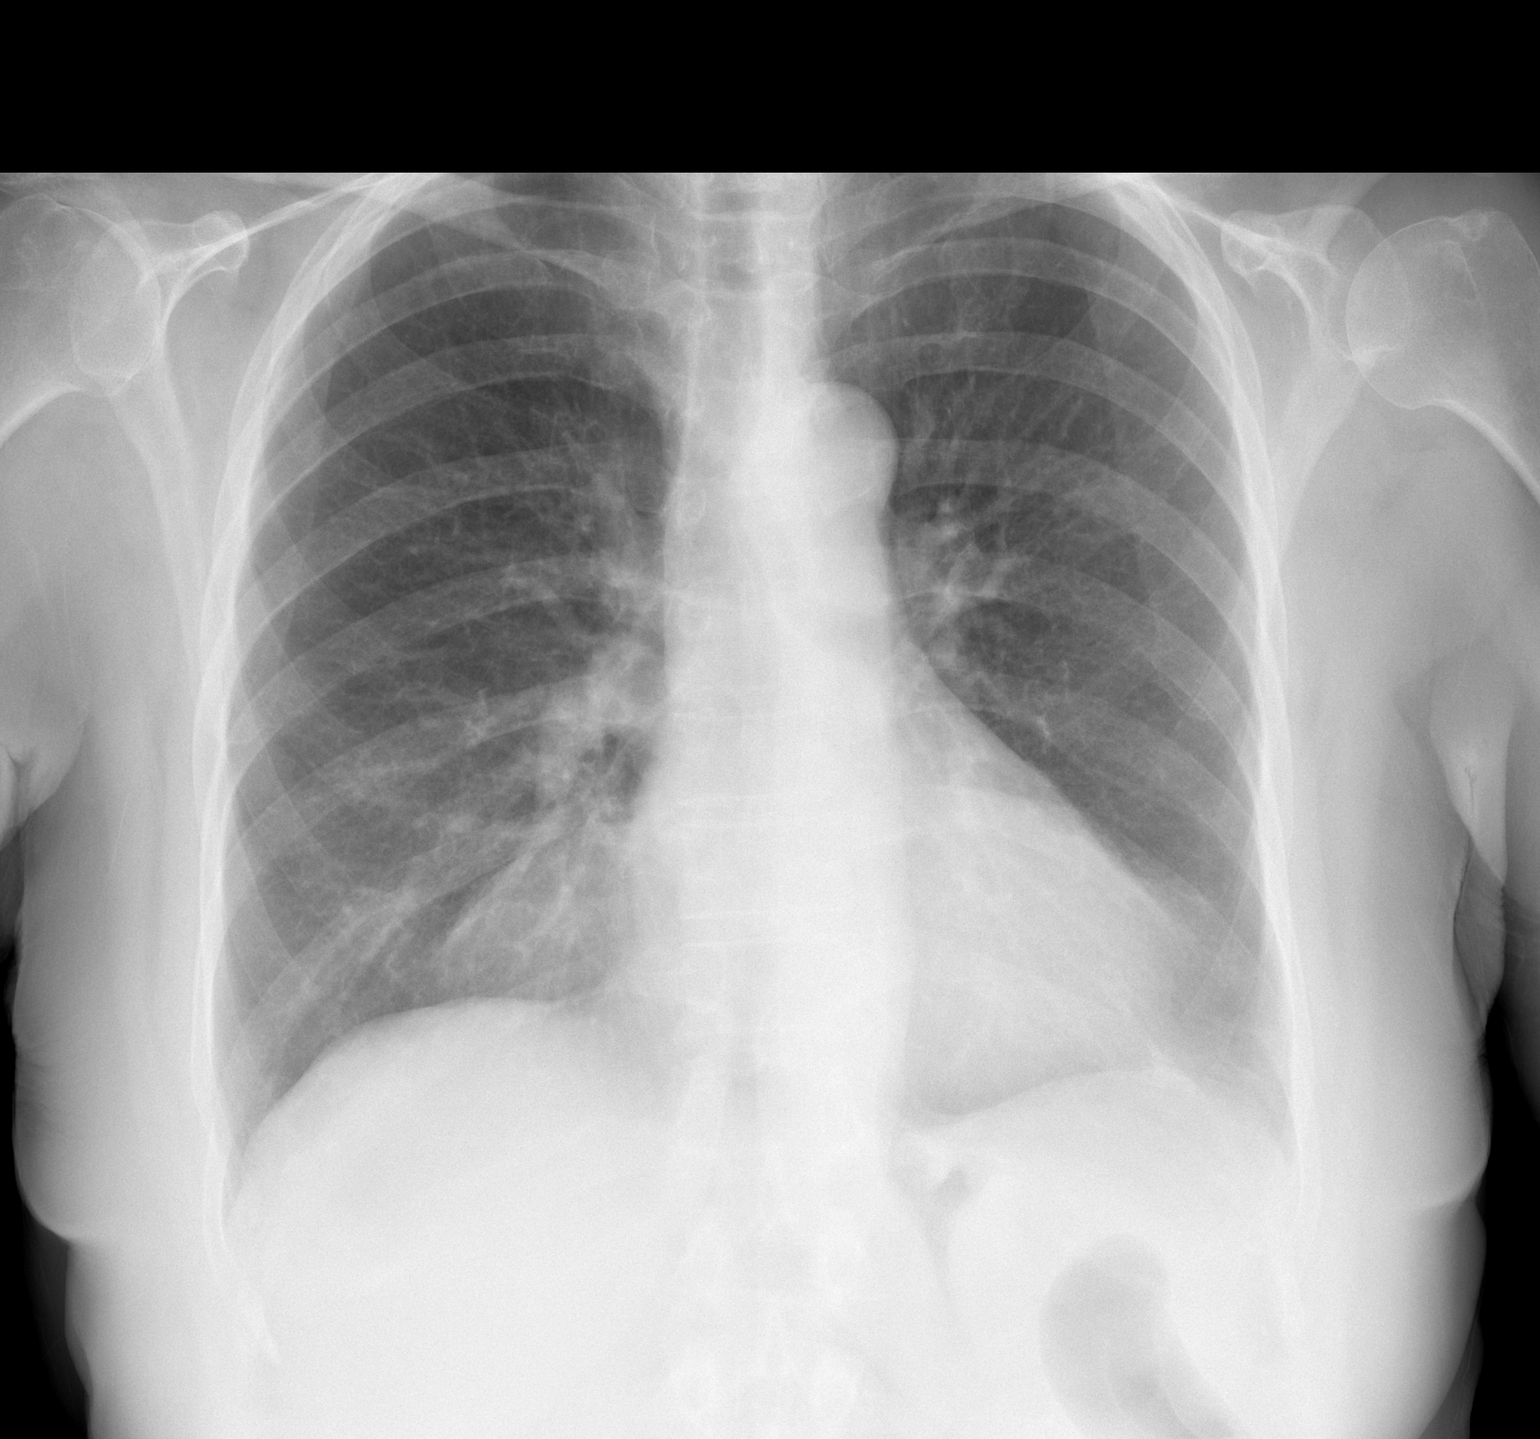

[w ribs ap/pa upper left *]
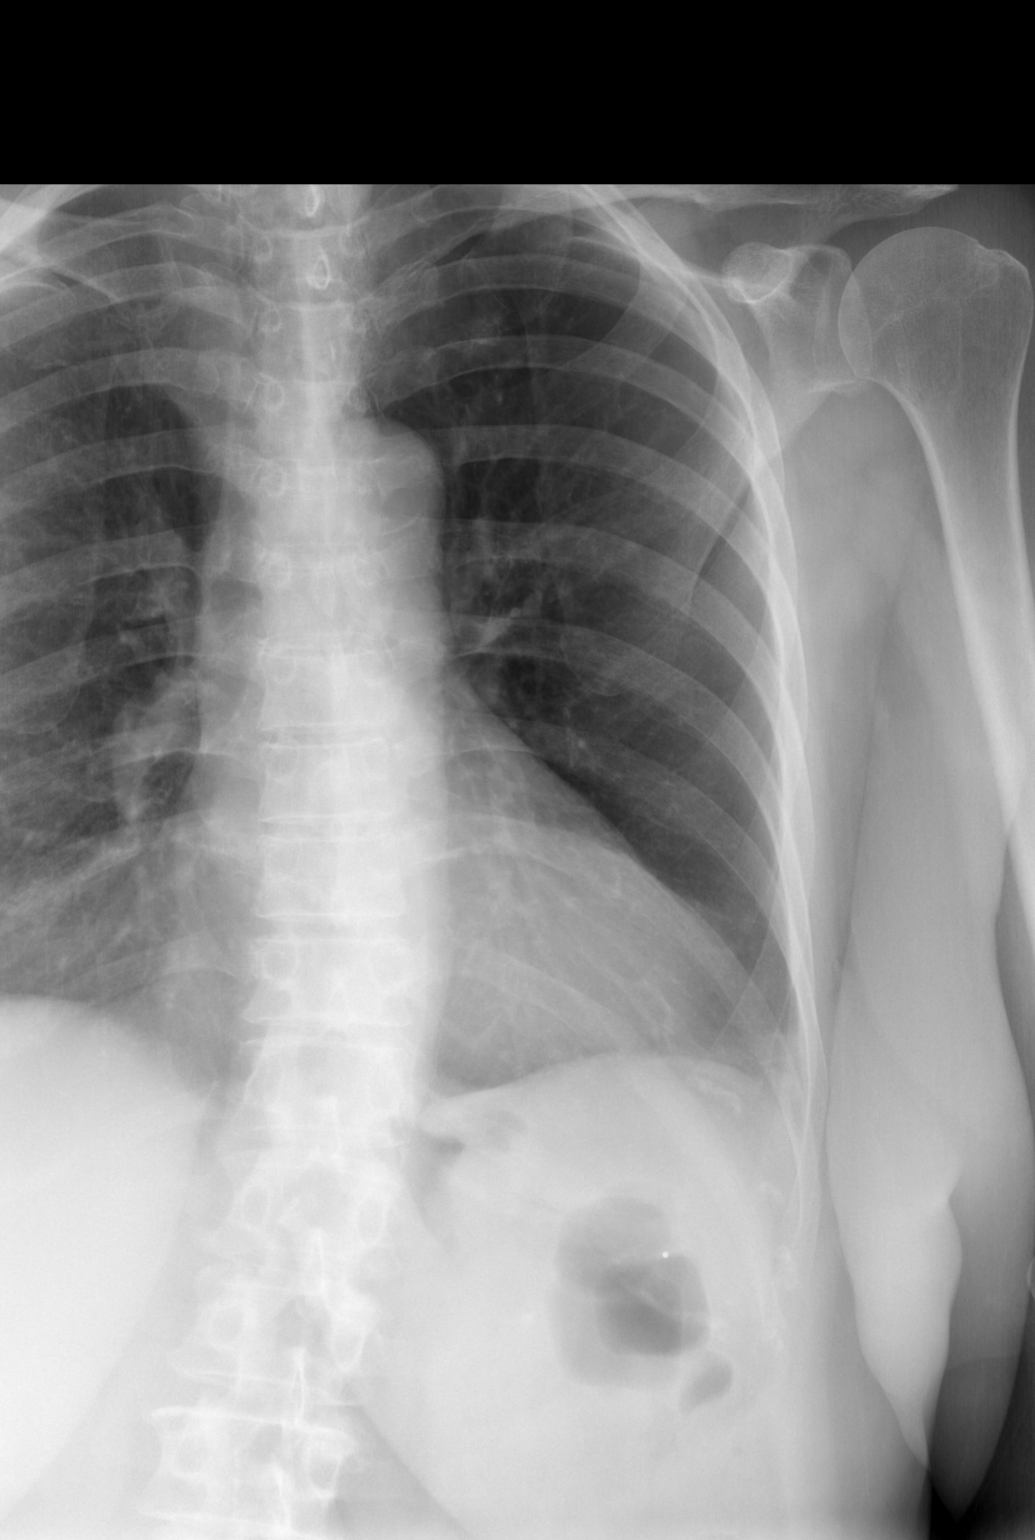

[w ribs oblique left *]
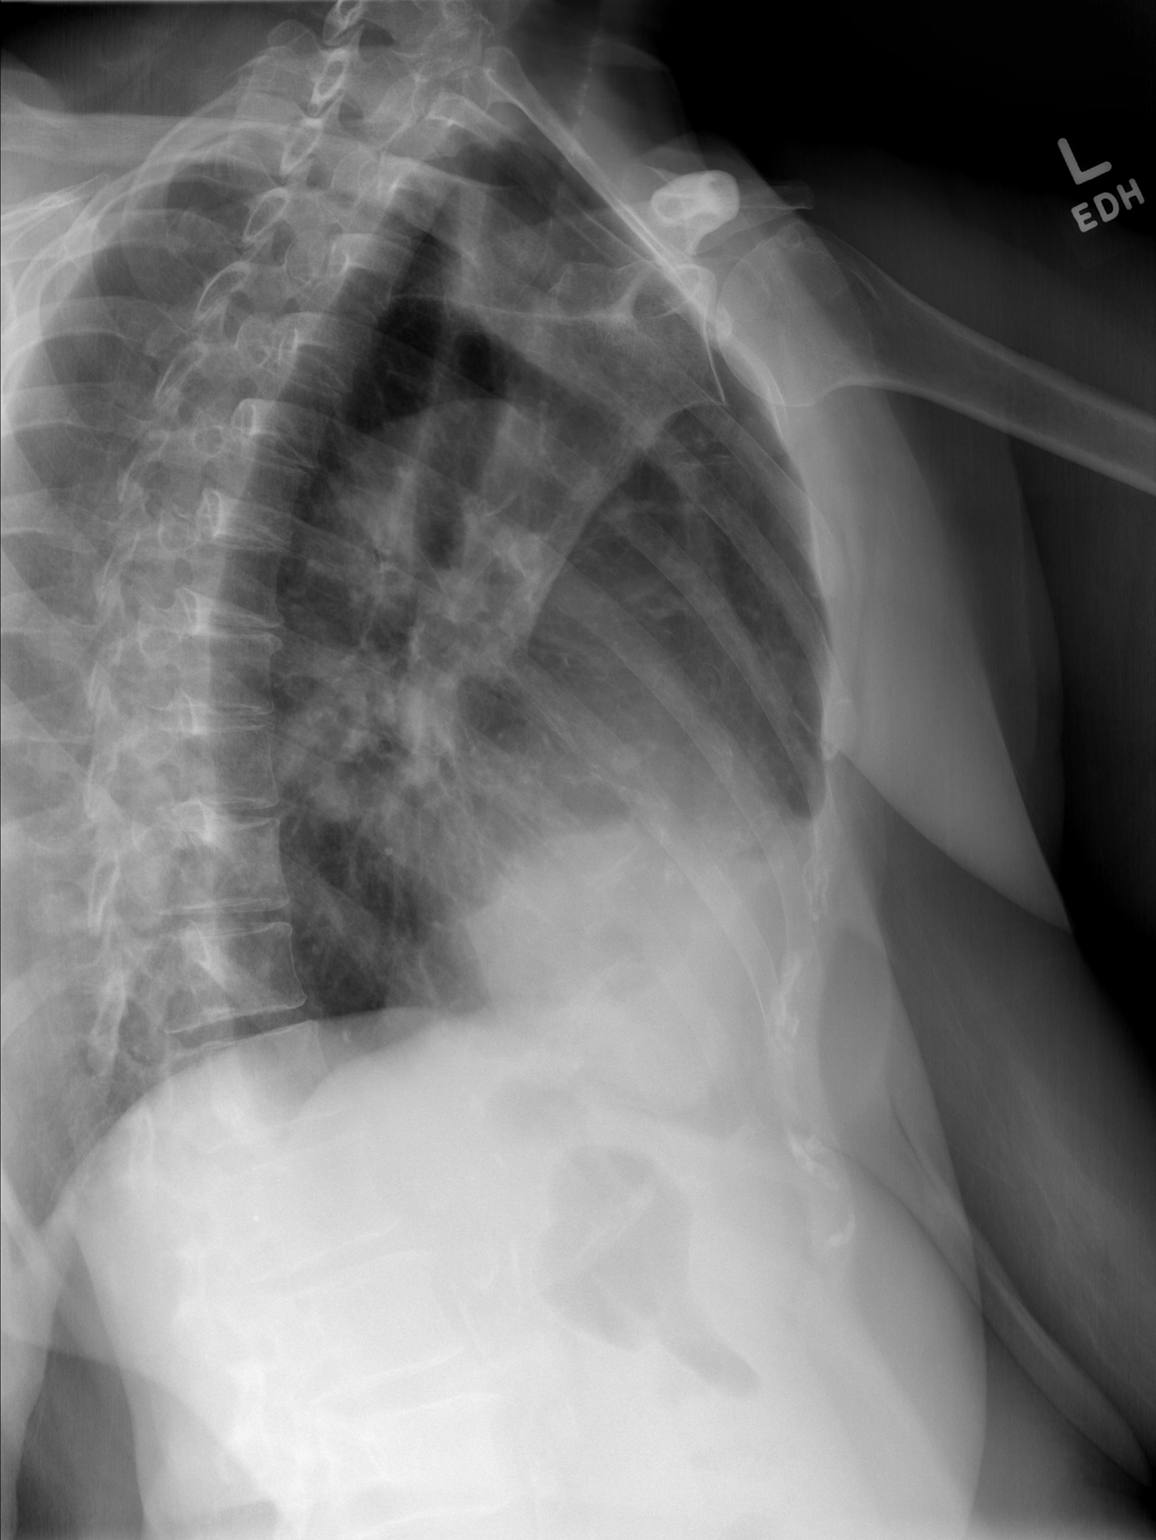

[3 of 3 positions shown; findings below may reference images not displayed]

FINDINGS: The cardiac silhouette, mediastinal and hilar contours are within
normal limits. Mildly prominent epicardial fat is noted on the right
side.

No infiltrates, edema or effusions.

Dedicated views of the left ribs do not demonstrate any definite
acute rib fractures or bone lesions.
IMPRESSION: No acute cardiopulmonary findings and no definite acute left-sided
rib fractures.

## 2021-11-14 ENCOUNTER — Ambulatory Visit
Admission: RE | Admit: 2021-11-14 | Discharge: 2021-11-14 | Disposition: A | Discharge status: Home / Self Care | Payer: PPO | Source: Ambulatory Visit | Attending: Internal Medicine | Admitting: Internal Medicine

## 2021-11-14 DIAGNOSIS — Z780 Asymptomatic menopausal state: Secondary | ICD-10-CM | POA: Diagnosis not present

## 2021-11-14 DIAGNOSIS — M8580 Other specified disorders of bone density and structure, unspecified site: Secondary | ICD-10-CM

## 2021-11-14 DIAGNOSIS — M8589 Other specified disorders of bone density and structure, multiple sites: Secondary | ICD-10-CM | POA: Diagnosis not present

## 2021-11-30 DIAGNOSIS — R7989 Other specified abnormal findings of blood chemistry: Secondary | ICD-10-CM | POA: Diagnosis not present

## 2021-11-30 DIAGNOSIS — M79606 Pain in leg, unspecified: Secondary | ICD-10-CM | POA: Diagnosis not present

## 2021-11-30 DIAGNOSIS — E663 Overweight: Secondary | ICD-10-CM | POA: Diagnosis not present

## 2021-11-30 DIAGNOSIS — E0590 Thyrotoxicosis, unspecified without thyrotoxic crisis or storm: Secondary | ICD-10-CM | POA: Diagnosis not present

## 2021-11-30 DIAGNOSIS — N1831 Chronic kidney disease, stage 3a: Secondary | ICD-10-CM | POA: Diagnosis not present

## 2021-11-30 DIAGNOSIS — E7800 Pure hypercholesterolemia, unspecified: Secondary | ICD-10-CM | POA: Diagnosis not present

## 2021-11-30 DIAGNOSIS — R946 Abnormal results of thyroid function studies: Secondary | ICD-10-CM | POA: Diagnosis not present

## 2021-11-30 DIAGNOSIS — Z1389 Encounter for screening for other disorder: Secondary | ICD-10-CM | POA: Diagnosis not present

## 2021-11-30 DIAGNOSIS — Z0000 Encounter for general adult medical examination without abnormal findings: Secondary | ICD-10-CM | POA: Diagnosis not present

## 2021-11-30 DIAGNOSIS — M8580 Other specified disorders of bone density and structure, unspecified site: Secondary | ICD-10-CM | POA: Diagnosis not present

## 2021-11-30 DIAGNOSIS — I10 Essential (primary) hypertension: Secondary | ICD-10-CM | POA: Diagnosis not present

## 2021-12-02 ENCOUNTER — Other Ambulatory Visit (HOSPITAL_COMMUNITY): Payer: Self-pay | Admitting: Internal Medicine

## 2021-12-02 ENCOUNTER — Other Ambulatory Visit: Payer: Self-pay | Admitting: Internal Medicine

## 2021-12-02 DIAGNOSIS — E0590 Thyrotoxicosis, unspecified without thyrotoxic crisis or storm: Secondary | ICD-10-CM

## 2021-12-19 ENCOUNTER — Encounter (HOSPITAL_COMMUNITY)
Admission: RE | Admit: 2021-12-19 | Discharge: 2021-12-19 | Disposition: A | Discharge status: Home / Self Care | Payer: PPO | Source: Ambulatory Visit | Attending: Internal Medicine | Admitting: Internal Medicine

## 2021-12-19 DIAGNOSIS — E0590 Thyrotoxicosis, unspecified without thyrotoxic crisis or storm: Secondary | ICD-10-CM | POA: Insufficient documentation

## 2021-12-19 MED ORDER — SODIUM IODIDE I-123 7.4 MBQ CAPS
414.0000 | ORAL_CAPSULE | Freq: Once | ORAL | Status: AC
  Administered 2021-12-19: 414 via ORAL

## 2021-12-20 ENCOUNTER — Encounter (HOSPITAL_COMMUNITY)
Admission: RE | Admit: 2021-12-20 | Discharge: 2021-12-20 | Disposition: A | Discharge status: Home / Self Care | Payer: PPO | Source: Ambulatory Visit | Attending: Internal Medicine | Admitting: Internal Medicine

## 2021-12-20 DIAGNOSIS — E0590 Thyrotoxicosis, unspecified without thyrotoxic crisis or storm: Secondary | ICD-10-CM | POA: Diagnosis not present

## 2022-01-22 DIAGNOSIS — E0590 Thyrotoxicosis, unspecified without thyrotoxic crisis or storm: Secondary | ICD-10-CM | POA: Diagnosis not present

## 2022-01-25 DIAGNOSIS — F419 Anxiety disorder, unspecified: Secondary | ICD-10-CM | POA: Diagnosis not present

## 2022-01-25 DIAGNOSIS — E069 Thyroiditis, unspecified: Secondary | ICD-10-CM | POA: Diagnosis not present

## 2022-01-31 DIAGNOSIS — E0590 Thyrotoxicosis, unspecified without thyrotoxic crisis or storm: Secondary | ICD-10-CM | POA: Diagnosis not present

## 2022-03-12 DIAGNOSIS — R946 Abnormal results of thyroid function studies: Secondary | ICD-10-CM | POA: Diagnosis not present

## 2022-03-12 DIAGNOSIS — E0590 Thyrotoxicosis, unspecified without thyrotoxic crisis or storm: Secondary | ICD-10-CM | POA: Diagnosis not present

## 2022-05-03 DIAGNOSIS — E069 Thyroiditis, unspecified: Secondary | ICD-10-CM | POA: Diagnosis not present

## 2022-05-28 DIAGNOSIS — Z23 Encounter for immunization: Secondary | ICD-10-CM | POA: Diagnosis not present

## 2022-06-07 DIAGNOSIS — Z1231 Encounter for screening mammogram for malignant neoplasm of breast: Secondary | ICD-10-CM | POA: Diagnosis not present

## 2022-06-18 DIAGNOSIS — E7800 Pure hypercholesterolemia, unspecified: Secondary | ICD-10-CM | POA: Diagnosis not present

## 2022-06-18 DIAGNOSIS — E663 Overweight: Secondary | ICD-10-CM | POA: Diagnosis not present

## 2022-06-18 DIAGNOSIS — E069 Thyroiditis, unspecified: Secondary | ICD-10-CM | POA: Diagnosis not present

## 2022-06-18 DIAGNOSIS — I10 Essential (primary) hypertension: Secondary | ICD-10-CM | POA: Diagnosis not present

## 2022-06-18 DIAGNOSIS — F411 Generalized anxiety disorder: Secondary | ICD-10-CM | POA: Diagnosis not present

## 2022-06-18 DIAGNOSIS — N1831 Chronic kidney disease, stage 3a: Secondary | ICD-10-CM | POA: Diagnosis not present

## 2022-06-18 DIAGNOSIS — I679 Cerebrovascular disease, unspecified: Secondary | ICD-10-CM | POA: Diagnosis not present

## 2022-07-30 DIAGNOSIS — E069 Thyroiditis, unspecified: Secondary | ICD-10-CM | POA: Diagnosis not present

## 2022-12-21 DIAGNOSIS — N1831 Chronic kidney disease, stage 3a: Secondary | ICD-10-CM | POA: Diagnosis not present

## 2022-12-21 DIAGNOSIS — Z0000 Encounter for general adult medical examination without abnormal findings: Secondary | ICD-10-CM | POA: Diagnosis not present

## 2022-12-21 DIAGNOSIS — E7800 Pure hypercholesterolemia, unspecified: Secondary | ICD-10-CM | POA: Diagnosis not present

## 2022-12-21 DIAGNOSIS — Z79899 Other long term (current) drug therapy: Secondary | ICD-10-CM | POA: Diagnosis not present

## 2022-12-21 DIAGNOSIS — I10 Essential (primary) hypertension: Secondary | ICD-10-CM | POA: Diagnosis not present

## 2022-12-21 DIAGNOSIS — Z1389 Encounter for screening for other disorder: Secondary | ICD-10-CM | POA: Diagnosis not present

## 2022-12-21 DIAGNOSIS — F4321 Adjustment disorder with depressed mood: Secondary | ICD-10-CM | POA: Diagnosis not present

## 2023-06-13 DIAGNOSIS — Z1231 Encounter for screening mammogram for malignant neoplasm of breast: Secondary | ICD-10-CM | POA: Diagnosis not present

## 2023-06-24 DIAGNOSIS — N1831 Chronic kidney disease, stage 3a: Secondary | ICD-10-CM | POA: Diagnosis not present

## 2023-06-24 DIAGNOSIS — E7800 Pure hypercholesterolemia, unspecified: Secondary | ICD-10-CM | POA: Diagnosis not present

## 2023-06-24 DIAGNOSIS — F419 Anxiety disorder, unspecified: Secondary | ICD-10-CM | POA: Diagnosis not present

## 2023-06-24 DIAGNOSIS — I10 Essential (primary) hypertension: Secondary | ICD-10-CM | POA: Diagnosis not present

## 2023-06-24 DIAGNOSIS — I679 Cerebrovascular disease, unspecified: Secondary | ICD-10-CM | POA: Diagnosis not present

## 2024-01-11 DIAGNOSIS — E7800 Pure hypercholesterolemia, unspecified: Secondary | ICD-10-CM | POA: Diagnosis not present

## 2024-01-11 DIAGNOSIS — I10 Essential (primary) hypertension: Secondary | ICD-10-CM | POA: Diagnosis not present

## 2024-01-11 DIAGNOSIS — N1831 Chronic kidney disease, stage 3a: Secondary | ICD-10-CM | POA: Diagnosis not present

## 2024-01-11 DIAGNOSIS — F4321 Adjustment disorder with depressed mood: Secondary | ICD-10-CM | POA: Diagnosis not present

## 2024-02-10 DIAGNOSIS — F4321 Adjustment disorder with depressed mood: Secondary | ICD-10-CM | POA: Diagnosis not present

## 2024-02-10 DIAGNOSIS — E7800 Pure hypercholesterolemia, unspecified: Secondary | ICD-10-CM | POA: Diagnosis not present

## 2024-02-10 DIAGNOSIS — I10 Essential (primary) hypertension: Secondary | ICD-10-CM | POA: Diagnosis not present

## 2024-02-10 DIAGNOSIS — N1831 Chronic kidney disease, stage 3a: Secondary | ICD-10-CM | POA: Diagnosis not present

## 2024-03-12 DIAGNOSIS — E7800 Pure hypercholesterolemia, unspecified: Secondary | ICD-10-CM | POA: Diagnosis not present

## 2024-03-12 DIAGNOSIS — F4321 Adjustment disorder with depressed mood: Secondary | ICD-10-CM | POA: Diagnosis not present

## 2024-03-12 DIAGNOSIS — I10 Essential (primary) hypertension: Secondary | ICD-10-CM | POA: Diagnosis not present

## 2024-03-12 DIAGNOSIS — N1831 Chronic kidney disease, stage 3a: Secondary | ICD-10-CM | POA: Diagnosis not present

## 2024-04-06 DIAGNOSIS — Z0000 Encounter for general adult medical examination without abnormal findings: Secondary | ICD-10-CM | POA: Diagnosis not present

## 2024-04-06 DIAGNOSIS — F419 Anxiety disorder, unspecified: Secondary | ICD-10-CM | POA: Diagnosis not present

## 2024-04-06 DIAGNOSIS — N1831 Chronic kidney disease, stage 3a: Secondary | ICD-10-CM | POA: Diagnosis not present

## 2024-04-06 DIAGNOSIS — Z23 Encounter for immunization: Secondary | ICD-10-CM | POA: Diagnosis not present

## 2024-04-06 DIAGNOSIS — I679 Cerebrovascular disease, unspecified: Secondary | ICD-10-CM | POA: Diagnosis not present

## 2024-04-06 DIAGNOSIS — I10 Essential (primary) hypertension: Secondary | ICD-10-CM | POA: Diagnosis not present

## 2024-04-06 DIAGNOSIS — Z1331 Encounter for screening for depression: Secondary | ICD-10-CM | POA: Diagnosis not present

## 2024-04-06 DIAGNOSIS — E7800 Pure hypercholesterolemia, unspecified: Secondary | ICD-10-CM | POA: Diagnosis not present

## 2024-04-12 DIAGNOSIS — I10 Essential (primary) hypertension: Secondary | ICD-10-CM | POA: Diagnosis not present

## 2024-04-12 DIAGNOSIS — E7800 Pure hypercholesterolemia, unspecified: Secondary | ICD-10-CM | POA: Diagnosis not present

## 2024-04-12 DIAGNOSIS — N1831 Chronic kidney disease, stage 3a: Secondary | ICD-10-CM | POA: Diagnosis not present

## 2024-04-12 DIAGNOSIS — F4321 Adjustment disorder with depressed mood: Secondary | ICD-10-CM | POA: Diagnosis not present

## 2024-05-12 DIAGNOSIS — N1831 Chronic kidney disease, stage 3a: Secondary | ICD-10-CM | POA: Diagnosis not present

## 2024-05-12 DIAGNOSIS — I10 Essential (primary) hypertension: Secondary | ICD-10-CM | POA: Diagnosis not present

## 2024-05-12 DIAGNOSIS — F4321 Adjustment disorder with depressed mood: Secondary | ICD-10-CM | POA: Diagnosis not present

## 2024-05-12 DIAGNOSIS — E7800 Pure hypercholesterolemia, unspecified: Secondary | ICD-10-CM | POA: Diagnosis not present

## 2024-06-12 DIAGNOSIS — E7800 Pure hypercholesterolemia, unspecified: Secondary | ICD-10-CM | POA: Diagnosis not present

## 2024-06-12 DIAGNOSIS — I10 Essential (primary) hypertension: Secondary | ICD-10-CM | POA: Diagnosis not present

## 2024-06-12 DIAGNOSIS — N1831 Chronic kidney disease, stage 3a: Secondary | ICD-10-CM | POA: Diagnosis not present

## 2024-06-12 DIAGNOSIS — F4321 Adjustment disorder with depressed mood: Secondary | ICD-10-CM | POA: Diagnosis not present

## 2024-06-22 DIAGNOSIS — Z1231 Encounter for screening mammogram for malignant neoplasm of breast: Secondary | ICD-10-CM | POA: Diagnosis not present

## 2024-07-12 DIAGNOSIS — I10 Essential (primary) hypertension: Secondary | ICD-10-CM | POA: Diagnosis not present

## 2024-07-12 DIAGNOSIS — E7800 Pure hypercholesterolemia, unspecified: Secondary | ICD-10-CM | POA: Diagnosis not present

## 2024-07-12 DIAGNOSIS — N1831 Chronic kidney disease, stage 3a: Secondary | ICD-10-CM | POA: Diagnosis not present

## 2024-07-12 DIAGNOSIS — F4321 Adjustment disorder with depressed mood: Secondary | ICD-10-CM | POA: Diagnosis not present
